# Patient Record
Sex: Male | Born: 1960 | Race: White | Hispanic: No | Marital: Married | Smoking: Former smoker
Health system: Southern US, Community
[De-identification: ages and names within clinical notes are randomized; demographics above are authoritative.]

## PROBLEM LIST (undated history)

## (undated) DIAGNOSIS — B019 Varicella without complication: Secondary | ICD-10-CM

## (undated) DIAGNOSIS — K8020 Calculus of gallbladder without cholecystitis without obstruction: Secondary | ICD-10-CM

## (undated) HISTORY — DX: Varicella without complication: B01.9

## (undated) HISTORY — DX: Calculus of gallbladder without cholecystitis without obstruction: K80.20

---
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
## 2008-12-18 ENCOUNTER — Emergency Department: Payer: Self-pay | Admitting: Internal Medicine

## 2009-03-18 HISTORY — PX: FINGER AMPUTATION: SHX636

## 2009-04-26 ENCOUNTER — Ambulatory Visit: Payer: Self-pay | Admitting: Family Medicine

## 2009-05-01 ENCOUNTER — Ambulatory Visit: Payer: Self-pay | Admitting: Family Medicine

## 2009-11-04 ENCOUNTER — Emergency Department: Payer: Self-pay | Admitting: Emergency Medicine

## 2010-04-16 ENCOUNTER — Ambulatory Visit: Payer: Self-pay | Admitting: Unknown Physician Specialty

## 2010-04-16 DIAGNOSIS — Z86010 Personal history of colonic polyps: Secondary | ICD-10-CM | POA: Insufficient documentation

## 2010-04-16 LAB — HM COLONOSCOPY

## 2010-04-18 LAB — PATHOLOGY REPORT

## 2013-02-02 LAB — LIPID PANEL
CHOLESTEROL: 218 mg/dL — AB (ref 0–200)
HDL: 38 mg/dL (ref 35–70)
LDL Cholesterol: 145 mg/dL
TRIGLYCERIDES: 177 mg/dL — AB (ref 40–160)

## 2013-02-02 LAB — BASIC METABOLIC PANEL
BUN: 10 mg/dL (ref 4–21)
CREATININE: 1 mg/dL (ref 0.6–1.3)
Glucose: 93 mg/dL
POTASSIUM: 4.2 mmol/L (ref 3.4–5.3)
SODIUM: 142 mmol/L (ref 137–147)

## 2013-02-02 LAB — PSA: PSA: 1.7

## 2013-02-02 LAB — HEPATIC FUNCTION PANEL
ALT: 33 U/L (ref 10–40)
AST: 22 U/L (ref 14–40)

## 2014-02-18 LAB — BASIC METABOLIC PANEL
BUN: 13 mg/dL (ref 4–21)
CREATININE: 1.4 mg/dL — AB (ref 0.6–1.3)
Glucose: 95 mg/dL
Potassium: 4.1 mmol/L (ref 3.4–5.3)
Sodium: 143 mmol/L (ref 137–147)

## 2014-02-18 LAB — LIPID PANEL
CHOLESTEROL: 230 mg/dL — AB (ref 0–200)
HDL: 35 mg/dL (ref 35–70)
LDL Cholesterol: 149 mg/dL
Triglycerides: 230 mg/dL — AB (ref 40–160)

## 2014-02-18 LAB — HEPATIC FUNCTION PANEL
ALT: 31 U/L (ref 10–40)
AST: 23 U/L (ref 14–40)

## 2014-02-18 LAB — CBC AND DIFFERENTIAL
HCT: 42 % (ref 41–53)
Hemoglobin: 15.5 g/dL (ref 13.5–17.5)
Platelets: 250 10*3/uL (ref 150–399)
WBC: 9.4 10^3/mL

## 2014-02-18 LAB — PSA: PSA: 1.9

## 2014-02-18 LAB — TSH: TSH: 1.06 u[IU]/mL (ref 0.41–5.90)

## 2014-09-20 ENCOUNTER — Telehealth: Payer: Self-pay

## 2014-09-20 NOTE — Telephone Encounter (Signed)
Patient called with complaint of skin rash. Patient first noticed it 3-4 days ago. Patient describes rash as red and itchy.Rash is located on his knees, shins, forearms, abdomen and hands.  Patient thinks it may be eczema. Patient has been using Hydrocortisone cream which has helped improve the rash. Patient states he has some blisters on the back of his hand. Patient denies any pain from the rash. Appointment scheduled with Dr. Caryn Section tomorrow 09/21/2014 at 1:45pm for evaluation.

## 2014-09-21 ENCOUNTER — Encounter: Payer: Self-pay | Admitting: *Deleted

## 2014-09-21 ENCOUNTER — Ambulatory Visit (INDEPENDENT_AMBULATORY_CARE_PROVIDER_SITE_OTHER): Payer: 59 | Admitting: Family Medicine

## 2014-09-21 ENCOUNTER — Encounter: Payer: Self-pay | Admitting: Family Medicine

## 2014-09-21 VITALS — BP 112/76 | HR 78 | Temp 98.7°F | Resp 16 | Wt 235.0 lb

## 2014-09-21 DIAGNOSIS — R21 Rash and other nonspecific skin eruption: Secondary | ICD-10-CM | POA: Insufficient documentation

## 2014-09-21 DIAGNOSIS — E669 Obesity, unspecified: Secondary | ICD-10-CM | POA: Insufficient documentation

## 2014-09-21 DIAGNOSIS — D487 Neoplasm of uncertain behavior of other specified sites: Secondary | ICD-10-CM | POA: Insufficient documentation

## 2014-09-21 DIAGNOSIS — L219 Seborrheic dermatitis, unspecified: Secondary | ICD-10-CM | POA: Insufficient documentation

## 2014-09-21 DIAGNOSIS — E785 Hyperlipidemia, unspecified: Secondary | ICD-10-CM | POA: Insufficient documentation

## 2014-09-21 DIAGNOSIS — Z720 Tobacco use: Secondary | ICD-10-CM

## 2014-09-21 MED ORDER — SULFAMETHOXAZOLE-TRIMETHOPRIM 800-160 MG PO TABS
2.0000 | ORAL_TABLET | Freq: Two times a day (BID) | ORAL | Status: AC
Start: 2014-09-21 — End: 2014-10-01

## 2014-09-21 NOTE — Progress Notes (Signed)
       Patient: Austin Larson Male    DOB: 1960/10/21   54 y.o.   MRN: [id number] Visit Date: 09/21/2014  Today's Provider: Lelon Huh, MD   Chief Complaint  Patient presents with  . Rash   Subjective:    Rash This is a new problem. The current episode started in the past 7 days (appeared 3-4 days ago). The problem has been gradually improving since onset. The affected locations include the left arm, right arm, right lowerleg and left lower leg. The rash is characterized by blistering, itchiness and redness. Pertinent negatives include no anorexia, congestion, cough, diarrhea, eye pain, facial edema, fatigue, fever, joint pain, nail changes, rhinorrhea, shortness of breath, sore throat or vomiting. Past treatments include anti-itch cream and antihistamine. The treatment provided mild relief. His past medical history is significant for eczema (as a child).    Has had no recent travel. No exposure to wild plants such as poison oak or ivy. Doesn't itch much at all during the day, but taking OTC antihistamine for itching at night.    Previous Medications   ASPIRIN 81 MG TABLET    Take by mouth.   COENZYME Q10 (COQ10 PO)    Take 1 tablet by mouth daily.   MULTIPLE VITAMIN PO    Take by mouth.   OMEGA-3 FATTY ACIDS (FISH OIL) 1000 MG CAPS    Take by mouth.    Review of Systems  Constitutional: Negative for fever and fatigue.  HENT: Negative for congestion, rhinorrhea and sore throat.   Eyes: Negative for pain.  Respiratory: Negative for cough and shortness of breath.   Gastrointestinal: Negative for vomiting, diarrhea and anorexia.  Musculoskeletal: Negative for joint pain.  Skin: Positive for rash. Negative for nail changes.    History  Substance Use Topics  . Smoking status: Current Some Day Smoker    Types: Cigars  . Smokeless tobacco: Not on file  . Alcohol Use: 0.0 oz/week    0 Standard drinks or equivalent per week     Comment: 1 beer every 2 weeks   Objective:   BP  112/76 mmHg  Pulse 78  Temp(Src) 98.7 F (37.1 C) (Oral)  Resp 16  Wt 235 lb (106.595 kg)  Physical Exam  Scattered scabbed lesions across flexor surfaces of arms, all around both LEs and a few scattered lesions across side of abdomen, with 1-2 mm erythema around central scabs. Moderate excoriations noted anterior leg.     Assessment & Plan:     1. Rash Doubt contact dermatitis due to only mild itching and lack of exposure. May be staph infection. Not at all consistent with tinea. No burrows seen. Cover from staph while awaiting culture results.  - sulfamethoxazole-trimethoprim (BACTRIM DS,SEPTRA DS) 800-160 MG per tablet; Take 2 tablets by mouth 2 (two) times daily.  Dispense: 40 tablet; Refill: 0 - Wound culture   Follow up: No Follow-up on file.      Lelon Huh, MD  [facility]

## 2014-09-23 ENCOUNTER — Telehealth: Payer: Self-pay | Admitting: Family Medicine

## 2014-09-23 LAB — WOUND CULTURE

## 2014-09-23 NOTE — Telephone Encounter (Signed)
Patient notified

## 2014-09-23 NOTE — Telephone Encounter (Signed)
So far culture has grown a few gram positive cocci, which is probably staph. Final report won't be back until Monday. Need to continue antibiotic for now.

## 2014-09-23 NOTE — Telephone Encounter (Signed)
Please advise 

## 2014-09-23 NOTE — Telephone Encounter (Signed)
Pt would like a call back to get the results for the culture that was done on Wednesday 09/21/14. Thanks TNP

## 2014-09-26 ENCOUNTER — Telehealth: Payer: Self-pay

## 2014-09-26 DIAGNOSIS — R21 Rash and other nonspecific skin eruption: Secondary | ICD-10-CM

## 2014-09-26 NOTE — Telephone Encounter (Signed)
-----   Message from Birdie Sons, MD sent at 09/23/2014  8:38 PM EDT ----- Cultures are positive for Staph bacteria. No MRSA. Should clear up with abx that were prescribed. Refer to dermatology if not clearing up.

## 2014-09-26 NOTE — Telephone Encounter (Signed)
LMTCB Emily Drozdowski, CMA  

## 2014-09-26 NOTE — Telephone Encounter (Signed)
Please refer to dermatology for rash. Thanks.

## 2014-09-26 NOTE — Telephone Encounter (Signed)
Pt advised, he would like to go ahead with the referral to Dermatology.  He reports that the infection is not getting any better, it is actually starting to spread.   Thanks,   -Mickel Baas

## 2014-10-03 NOTE — Addendum Note (Signed)
Addended by: Birdie Sons on: 10/03/2014 01:26 PM   Modules accepted: Orders

## 2014-10-03 NOTE — Telephone Encounter (Signed)
Please refer dermatology

## 2014-11-22 ENCOUNTER — Encounter: Payer: Self-pay | Admitting: Family Medicine

## 2014-11-30 ENCOUNTER — Encounter: Payer: Self-pay | Admitting: Family Medicine

## 2014-12-13 ENCOUNTER — Encounter: Payer: Self-pay | Admitting: Family Medicine

## 2014-12-29 ENCOUNTER — Ambulatory Visit (INDEPENDENT_AMBULATORY_CARE_PROVIDER_SITE_OTHER): Payer: 59 | Admitting: Family Medicine

## 2014-12-29 ENCOUNTER — Encounter: Payer: Self-pay | Admitting: Family Medicine

## 2014-12-29 VITALS — BP 122/86 | HR 68 | Temp 98.2°F | Resp 16 | Ht 71.5 in | Wt 236.0 lb

## 2014-12-29 DIAGNOSIS — E669 Obesity, unspecified: Secondary | ICD-10-CM | POA: Diagnosis not present

## 2014-12-29 DIAGNOSIS — E785 Hyperlipidemia, unspecified: Secondary | ICD-10-CM | POA: Diagnosis not present

## 2014-12-29 DIAGNOSIS — Z8249 Family history of ischemic heart disease and other diseases of the circulatory system: Secondary | ICD-10-CM | POA: Diagnosis not present

## 2014-12-29 DIAGNOSIS — Z0000 Encounter for general adult medical examination without abnormal findings: Secondary | ICD-10-CM

## 2014-12-29 DIAGNOSIS — R079 Chest pain, unspecified: Secondary | ICD-10-CM

## 2014-12-29 NOTE — Progress Notes (Signed)
Patient: Austin Larson, Male    DOB: 23-Apr-1960, 54 y.o.   MRN: [id number] Visit Date: 12/29/2014  Today's Provider: Lelon Huh, MD   Chief Complaint  Patient presents with  . Annual Exam  . Hyperlipidemia    follow up  . Blood Pressure Check    follow up elevated Blood pressure   Subjective:    Annual physical exam Austin Larson is a 54 y.o. male who presents today for health maintenance and complete physical. He feels well. He reports no regular exercise . He reports he is sleeping well.  -----------------------------------------------------------------  Lipid/Cholesterol, Follow-up:   Last seen for this10 months ago. Patient was seen by Vernie Murders PA-C. Management changes during that visit include recommend taking Krill oil or red yeast rice with Metamucil, watching diet and exercising 30 minutes daily. . Last Lipid Panel:    Component Value Date/Time   CHOL 230* 02/18/2014   TRIG 230* 02/18/2014   HDL 35 02/18/2014   LDLCALC 149 02/18/2014    Risk factors for vascular disease include hypercholesterolemia and family history.    Patient did not start taking Krill oil or Red Yeast   Current symptoms include none and have been stable. Weight trend: stable Prior visit with dietician: no Current diet: in general, a "healthy" diet   Current exercise: none   Weight trend: stable Wt Readings from Last 3 Encounters:  09/21/14 235 lb (106.595 kg)  02/15/14 242 lb (109.77 kg)    Current diet: in general, a "healthy" diet    ------------------------------------------------------------------------  Chest pain He states he occasionally has brief episodes of chest pains that do not radiate. Unclear if related to exertion. No associated dyspnea or diaphoresis. Lasts just a few seconds to a few minutes. They are not very severe but he is concerned due to his father developing heart disease in his 83s.    Review of Systems  Constitutional: Negative for  fever, chills, appetite change and fatigue.  HENT: Negative for congestion, ear pain, hearing loss, nosebleeds and trouble swallowing.   Eyes: Negative for pain and visual disturbance.  Respiratory: Negative for cough, chest tightness and shortness of breath.   Cardiovascular: Positive for chest pain. Negative for palpitations and leg swelling.  Gastrointestinal: Negative for nausea, vomiting, abdominal pain, diarrhea, constipation and blood in stool.  Endocrine: Negative for polydipsia, polyphagia and polyuria.  Genitourinary: Negative for dysuria and flank pain.  Musculoskeletal: Negative for myalgias, back pain, joint swelling, arthralgias and neck stiffness.  Skin: Negative for color change, rash and wound.  Neurological: Negative for dizziness, tremors, seizures, speech difficulty, weakness, light-headedness and headaches.  Psychiatric/Behavioral: Negative for behavioral problems, confusion, sleep disturbance, dysphoric mood and decreased concentration. The patient is not nervous/anxious.   All other systems reviewed and are negative.   Social History He  reports that he has been smoking Cigars.  He does not have any smokeless tobacco history on file. He reports that he drinks alcohol. He reports that he does not use illicit drugs. Social History   Social History  . Marital Status: Married    Spouse Name: N/A  . Number of Children: 3  . Years of Education: Coll Grad   Occupational History  . Full-Time    Social History Main Topics  . Smoking status: Current Some Day Smoker    Types: Cigars  . Smokeless tobacco: Not on file  . Alcohol Use: 0.0 oz/week    0 Standard drinks or equivalent per week  Comment: 1 beer every 2 weeks  . Drug Use: No  . Sexual Activity: Not on file   Other Topics Concern  . Not on file   Social History Narrative    Patient Active Problem List   Diagnosis Date Noted  . Dyslipidemia 09/21/2014  . Obesity 09/21/2014  . Seborrhea 09/21/2014    . Rash 09/21/2014  . Tobacco abuse 09/21/2014  . History of adenomatous polyp of colon 04/16/2010  . Family history of ischemic heart disease 08/17/2008  . Fatty liver 10/21/2005  . Gallstones 03/18/2004    Past Surgical History  Procedure Laterality Date  . Appendectomy  1974  . Tonsillectomy  1970  . 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 Mother Deceased 65    Breast and Ovarian cancer  . Father Alive   . Sister Alive   . Brother Alive   . Daughter Alive   . Daughter Alive   . Daughter Alive    His family history includes Bipolar disorder in his father; Breast cancer in his mother; Diabetes in his father; Heart disease in his father; Ovarian cancer in his mother.    No Known Allergies  Previous Medications   ASPIRIN 81 MG TABLET    Take by mouth.   COENZYME Q10 (COQ10 PO)    Take 1 tablet by mouth daily.   MULTIPLE VITAMIN PO    Take by mouth.   OMEGA-3 FATTY ACIDS (FISH OIL) 1000 MG CAPS    Take by mouth.    Patient Care Team: Birdie Sons, MD as PCP - General (Family Medicine)     Objective:   Vitals: BP 122/86 mmHg  Pulse 68  Temp(Src) 98.2 F (36.8 C) (Oral)  Resp 16  Ht 5' 11.5" (1.816 m)  Wt 236 lb (107.049 kg)  BMI 32.46 kg/m2   Physical Exam   General Appearance:    Alert, cooperative, no distress, appears stated age, overweight  Head:    Normocephalic, without obvious abnormality, atraumatic  Eyes:    PERRL, conjunctiva/corneas clear, EOM's intact, fundi    benign, both eyes       Ears:    Normal TM's and external ear canals, both ears  Nose:   Nares normal, septum midline, mucosa normal, no drainage   or sinus tenderness  Throat:   Lips, mucosa, and tongue normal; teeth and gums normal  Neck:   Supple, symmetrical, trachea midline, no adenopathy;       thyroid:  No enlargement/tenderness/nodules; no carotid   bruit or JVD  Back:     Symmetric, no  curvature, ROM normal, no CVA tenderness  Lungs:     Clear to auscultation bilaterally, respirations unlabored  Chest wall:    No tenderness or deformity  Heart:    Regular rate and rhythm, S1 and S2 normal, no murmur, rub   or gallop  Abdomen:     Soft, non-tender, bowel sounds active all four quadrants,    no masses, no organomegaly  Genitalia:    deferred  Rectal:    deferred  Extremities:   Extremities normal, atraumatic, no cyanosis or edema  Pulses:   2+ and symmetric all extremities  Skin:   Skin color, texture, turgor normal, no rashes or lesions  Lymph nodes:   Cervical, supraclavicular, and axillary nodes normal  Neurologic:   CNII-XII intact. Normal strength, sensation and reflexes  throughout    Depression Screen PHQ 2/9 Scores 12/29/2014  PHQ - 2 Score 0  PHQ- 9 Score 0      Assessment & Plan:     Routine Health Maintenance and Physical Exam  Exercise Activities and Dietary recommendations Goals    None      Immunization History  Administered Date(s) Administered  . Influenza,inj,Quad PF,36+ Mos 12/16/2013  . Tdap 08/17/2008    Health Maintenance  Topic Date Due  . HIV Screening  10/09/1973  . INFLUENZA VACCINE  06/17/2015 (Originally 10/17/2014)  . COLONOSCOPY  04/17/2015  . TETANUS/TDAP  08/18/2018  . Hepatitis C Screening  Completed      Discussed health benefits of physical activity, and encouraged him to engage in regular exercise appropriate for his age and condition.    --------------------------------------------------------------------  1. Annual physical exam  - Comprehensive metabolic panel - PSA  2. Obesity   3. Family history of ischemic heart disease  - Ambulatory referral to Cardiology  4. Dyslipidemia  - Lipid panel - TSH  5. Chest pain, unspecified chest pain type Not typical, but does have significant cardiac risk factors. Continue ASA for now.  - Ambulatory referral to Cardiology

## 2014-12-29 NOTE — Patient Instructions (Addendum)
   Please contact your eyecare professional to schedule a routine eye exam  

## 2014-12-30 ENCOUNTER — Telehealth: Payer: Self-pay | Admitting: Family Medicine

## 2014-12-30 LAB — COMPREHENSIVE METABOLIC PANEL
ALT: 28 IU/L (ref 0–44)
AST: 23 IU/L (ref 0–40)
Albumin/Globulin Ratio: 2.4 (ref 1.1–2.5)
Albumin: 4.7 g/dL (ref 3.5–5.5)
Alkaline Phosphatase: 75 IU/L (ref 39–117)
BUN/Creatinine Ratio: 7 — ABNORMAL LOW (ref 9–20)
BUN: 10 mg/dL (ref 6–24)
Bilirubin Total: 0.6 mg/dL (ref 0.0–1.2)
CALCIUM: 9.7 mg/dL (ref 8.7–10.2)
CHLORIDE: 102 mmol/L (ref 97–108)
CO2: 28 mmol/L (ref 18–29)
Creatinine, Ser: 1.44 mg/dL — ABNORMAL HIGH (ref 0.76–1.27)
GFR calc Af Amer: 62 mL/min/{1.73_m2} (ref >59)
GFR, EST NON AFRICAN AMERICAN: 54 mL/min/{1.73_m2} — AB (ref >59)
Globulin, Total: 2 g/dL (ref 1.5–4.5)
Glucose: 93 mg/dL (ref 65–99)
Potassium: 4.5 mmol/L (ref 3.5–5.2)
Sodium: 145 mmol/L — ABNORMAL HIGH (ref 134–144)
Total Protein: 6.7 g/dL (ref 6.0–8.5)

## 2014-12-30 LAB — PSA: Prostate Specific Ag, Serum: 1.9 ng/mL (ref 0.0–4.0)

## 2014-12-30 LAB — LIPID PANEL
CHOL/HDL RATIO: 6.1 ratio — AB (ref 0.0–5.0)
Cholesterol, Total: 227 mg/dL — ABNORMAL HIGH (ref 100–199)
HDL: 37 mg/dL — ABNORMAL LOW (ref >39)
LDL Calculated: 150 mg/dL — ABNORMAL HIGH (ref 0–99)
Triglycerides: 202 mg/dL — ABNORMAL HIGH (ref 0–149)
VLDL Cholesterol Cal: 40 mg/dL (ref 5–40)

## 2014-12-30 LAB — TSH: TSH: 0.661 u[IU]/mL (ref 0.450–4.500)

## 2014-12-30 NOTE — Telephone Encounter (Signed)
-----   Message from Birdie Sons, MD sent at 12/30/2014  7:59 AM EDT ----- LDL cholesterol it too high at 150 and HDL is too low at 37, which puts him at twice normal risk for developing heart or vascular disease. Recommend he start pravastatin 40mg  daily, #30, rf x 3 and return in 6-8 weeks for office visit and labs.   All other labs are normal.

## 2014-12-30 NOTE — Telephone Encounter (Signed)
Pt is returning call.  CB#[phone]/MW

## 2014-12-30 NOTE — Telephone Encounter (Signed)
Patient stated that he does not want to start a medication for cholesterol at this time. Patient wants to try diet and exercise for the next 6 weeks, then come back for follow-up w/labs. Patient said that if Dr. Caryn Section is all right with this he would like to have labs done before ov. Please advise? Patient already scheduled follow-up ov appt for 02/01/15.

## 2014-12-30 NOTE — Telephone Encounter (Signed)
Patient notified of results. Patient expressed understanding.  

## 2015-02-01 ENCOUNTER — Ambulatory Visit: Payer: Self-pay | Admitting: Family Medicine

## 2015-03-03 ENCOUNTER — Ambulatory Visit (INDEPENDENT_AMBULATORY_CARE_PROVIDER_SITE_OTHER): Payer: 59 | Admitting: Cardiovascular Disease

## 2015-03-03 ENCOUNTER — Encounter: Payer: Self-pay | Admitting: Cardiovascular Disease

## 2015-03-03 VITALS — BP 120/88 | HR 73 | Ht 71.0 in | Wt 232.0 lb

## 2015-03-03 DIAGNOSIS — Z8249 Family history of ischemic heart disease and other diseases of the circulatory system: Secondary | ICD-10-CM | POA: Diagnosis not present

## 2015-03-03 DIAGNOSIS — R079 Chest pain, unspecified: Secondary | ICD-10-CM | POA: Diagnosis not present

## 2015-03-03 DIAGNOSIS — R0789 Other chest pain: Secondary | ICD-10-CM | POA: Insufficient documentation

## 2015-03-03 DIAGNOSIS — E785 Hyperlipidemia, unspecified: Secondary | ICD-10-CM | POA: Diagnosis not present

## 2015-03-03 NOTE — Assessment & Plan Note (Signed)
The chest pain is atypical and nonexertional. His cardiac physical exam is unremarkable and baseline EKG is normal. Given his family history of coronary artery disease, I requested a treadmill stress test. I discussed with the patient the importance of lifestyle changes in order to decrease the chance of future coronary artery disease and cardiovascular events. We discussed the importance of controlling risk factors, healthy diet as well as regular exercise. I also explained to him that a normal stress test does not rule out atherosclerosis.

## 2015-03-03 NOTE — Assessment & Plan Note (Signed)
Given his family history of coronary artery disease and hyperlipidemia, I discussed with him the option of proceeding with coronary calcium score if stress test comes back normal. He will decide after stress test.

## 2015-03-03 NOTE — Patient Instructions (Signed)
Medication Instructions:  Your physician recommends that you continue on your current medications as directed. Please refer to the Current Medication list given to you today.   Labwork: none  Testing/Procedures: Your physician has requested that you have an exercise tolerance test. For further information please visit [email or web address]. Please also follow instruction sheet, as given.    Follow-Up: Your physician recommends that you schedule a follow-up appointment as needed.    Any Other Special Instructions Will Be Listed Below (If Applicable).     If you need a refill on your cardiac medications before your next appointment, please call your pharmacy.  Exercise Stress Electrocardiogram An exercise stress electrocardiogram is a test that is done to evaluate the blood supply to your heart. This test may also be called exercise stress electrocardiography. The test is done while you are walking on a treadmill. The goal of this test is to raise your heart rate. This test is done to find areas of poor blood flow to the heart by determining the extent of coronary artery disease (CAD).   CAD is defined as narrowing in one or more heart (coronary) arteries of more than 70%. If you have an abnormal test result, this may mean that you are not getting adequate blood flow to your heart during exercise. Additional testing may be needed to understand why your test was abnormal. LET [facility] CARE PROVIDER KNOW ABOUT:   Any allergies you have.  All medicines you are taking, including vitamins, herbs, eye drops, creams, and over-the-counter medicines.  Previous problems you or members of your family have had with the use of anesthetics.  Any blood disorders you have.  Previous surgeries you have had.  Medical conditions you have.  Possibility of pregnancy, if this applies. RISKS AND COMPLICATIONS Generally, this is a safe procedure. However, as with any procedure, complications can  occur. Possible complications can include:  Pain or pressure in the following areas:  Chest.  Jaw or neck.  Between your shoulder blades.  Radiating down your left arm.  Dizziness or light-headedness.  Shortness of breath.  Increased or irregular heartbeats.  Nausea or vomiting.  Heart attack (rare). BEFORE THE PROCEDURE  Avoid all forms of caffeine 24 hours before your test or as directed by your health care provider. This includes coffee, tea (even decaffeinated tea), caffeinated sodas, chocolate, cocoa, and certain pain medicines.  Follow your health care provider's instructions regarding eating and drinking before the test.  Take your medicines as directed at regular times with water unless instructed otherwise. Exceptions may include:  If you have diabetes, ask how you are to take your insulin or pills. It is common to adjust insulin dosing the morning of the test.  If you are taking beta-blocker medicines, it is important to talk to your health care provider about these medicines well before the date of your test. Taking beta-blocker medicines may interfere with the test. In some cases, these medicines need to be changed or stopped 24 hours or more before the test.  If you wear a nitroglycerin patch, it may need to be removed prior to the test. Ask your health care provider if the patch should be removed before the test.  If you use an inhaler for any breathing condition, bring it with you to the test.  If you are an outpatient, bring a snack so you can eat right after the stress phase of the test.  Do not smoke for 4 hours prior to the test or as  directed by your health care provider.  Do not apply lotions, powders, creams, or oils on your chest prior to the test.  Wear loose-fitting clothes and comfortable shoes for the test. This test involves walking on a treadmill. PROCEDURE  Multiple patches (electrodes) will be put on your chest. If needed, small areas of  your chest may have to be shaved to get better contact with the electrodes. Once the electrodes are attached to your body, multiple wires will be attached to the electrodes and your heart rate will be monitored.  Your heart will be monitored both at rest and while exercising.  You will walk on a treadmill. The treadmill will be started at a slow pace. The treadmill speed and incline will gradually be increased to raise your heart rate. AFTER THE PROCEDURE  Your heart rate and blood pressure will be monitored after the test.  You may return to your normal schedule including diet, activities, and medicines, unless your health care provider tells you otherwise.   This information is not intended to replace advice given to you by your health care provider. Make sure you discuss any questions you have with your health care provider.   Document Released: 03/01/2000 Document Revised: 03/09/2013 Document Reviewed: 11/09/2012 Elsevier Interactive Patient Education [employer].

## 2015-03-03 NOTE — Assessment & Plan Note (Signed)
Lab Results  Component Value Date   CHOL 227* 12/29/2014   HDL 37* 12/29/2014   LDLCALC 150* 12/29/2014   TRIG 202* 12/29/2014   CHOLHDL 6.1* 12/29/2014   The patient did not start treatment with pravastatin as was prescribed. I discussed with him the risks and benefits of treatment with statins. His lipids have been elevated over the last few years and I think he should strongly consider this. I do think that if there is atherosclerosis on CT calcium score, that will strongly favor aggressive treatment of his lipids.

## 2015-03-03 NOTE — Progress Notes (Signed)
Primary care physician: Dr. Caryn Section.  HPI  This is a pleasant 54 year old male who was referred for evaluation of chest pain. He has no previous cardiac history. He has known history of hyperlipidemia but no history of diabetes or hypertension. He is not a smoker. He smokes one to 2 cigars per year with no other nicotine products. He has family history of premature coronary artery disease. His father had CABG more than one time starting in his 31s. He was a smoker and a diabetic. The patient has known history of hyperlipidemia with most recent LDL of 150. He was started on pravastatin but did not take the medication as he is concerned about taking any statin. He reports intermittent rare episodes of left-sided chest pain usually not exertional lasting for a few minutes and described as aching sensation with no other associated symptoms. There has been no syncope, presyncope or dizziness. He works as a Hotel manager. He reports having cardiac workup about 20 years ago.  No Known Allergies   Current Outpatient Prescriptions on File Prior to Visit  Medication Sig Dispense Refill  . aspirin 81 MG tablet Take by mouth.    . Coenzyme Q10 (COQ10 PO) Take 1 tablet by mouth daily.    . MULTIPLE VITAMIN PO Take by mouth.    . Omega-3 Fatty Acids (FISH OIL) 1000 MG CAPS Take by mouth.     No current facility-administered medications on file prior to visit.     Past Medical History  Diagnosis Date  . Chicken pox   . Gallstones      Past Surgical History  Procedure Laterality Date  . Appendectomy  1974  . Tonsillectomy  1970  . 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 Ovarian cancer Mother   . Breast cancer Mother   . Diabetes Father   . Heart disease Father   . Bipolar disorder Father      Social History   Social History  . Marital Status: Married    Spouse Name: N/A  . Number of Children: 3  . Years of  Education: Coll Grad   Occupational History  . Investment banker, operational    Social History Main Topics  . Smoking status: Current Some Day Smoker    Types: Cigars  . Smokeless tobacco: Not on file  . Alcohol Use: 0.0 oz/week    0 Standard drinks or equivalent per week     Comment: 1 beer every 2 weeks  . Drug Use: No  . Sexual Activity: Not on file   Other Topics Concern  . Not on file   Social History Narrative     ROS A 10 point review of system was performed. It is negative other than that mentioned in the history of present illness.   PHYSICAL EXAM   BP 120/88 mmHg  Pulse 73  Ht 5\' 11"  (1.803 m)  Wt 232 lb (105.235 kg)  BMI 32.37 kg/m2 Constitutional: He is oriented to person, place, and time. He appears well-developed and well-nourished. No distress.  HENT: No nasal discharge.  Head: Normocephalic and atraumatic.  Eyes: Pupils are equal and round.  No discharge. Neck: Normal range of motion. Neck supple. No JVD present. No thyromegaly present.  Cardiovascular: Normal rate, regular rhythm, normal heart sounds. Exam reveals no gallop and no friction rub. No murmur heard.  Pulmonary/Chest: Effort normal and breath sounds normal. No stridor.  No respiratory distress. He has no wheezes. He has no rales. He exhibits no tenderness.  Abdominal: Soft. Bowel sounds are normal. He exhibits no distension. There is no tenderness. There is no rebound and no guarding.  Musculoskeletal: Normal range of motion. He exhibits no edema and no tenderness.  Neurological: He is alert and oriented to person, place, and time. Coordination normal.  Skin: Skin is warm and dry. No rash noted. He is not diaphoretic. No erythema. No pallor.  Psychiatric: He has a normal mood and affect. His behavior is normal. Judgment and thought content normal.       EKG: Normal sinus rhythm with no significant ST or T wave changes.   ASSESSMENT AND PLAN

## 2015-04-12 ENCOUNTER — Encounter (INDEPENDENT_AMBULATORY_CARE_PROVIDER_SITE_OTHER): Payer: Self-pay

## 2015-04-12 ENCOUNTER — Ambulatory Visit (INDEPENDENT_AMBULATORY_CARE_PROVIDER_SITE_OTHER): Payer: 59

## 2015-04-12 DIAGNOSIS — R079 Chest pain, unspecified: Secondary | ICD-10-CM

## 2015-04-12 LAB — EXERCISE TOLERANCE TEST
CSEPHR: 90 %
CSEPPHR: 148 {beats}/min
Estimated workload: 10.1 METS
Exercise duration (min): 8 min
Exercise duration (sec): 59 s
MPHR: 164 {beats}/min
Rest HR: 76 {beats}/min

## 2016-01-25 ENCOUNTER — Ambulatory Visit (INDEPENDENT_AMBULATORY_CARE_PROVIDER_SITE_OTHER): Payer: 59

## 2016-01-25 DIAGNOSIS — Z23 Encounter for immunization: Secondary | ICD-10-CM | POA: Diagnosis not present

## 2016-03-04 ENCOUNTER — Telehealth: Payer: Self-pay | Admitting: Cardiovascular Disease

## 2016-03-04 ENCOUNTER — Other Ambulatory Visit: Payer: Self-pay

## 2016-03-04 DIAGNOSIS — Z8249 Family history of ischemic heart disease and other diseases of the circulatory system: Secondary | ICD-10-CM

## 2016-03-04 NOTE — Telephone Encounter (Signed)
Per 03/03/15 notes: "Given his family history of coronary artery disease and hyperlipidemia, I discussed with him the option of proceeding with coronary calcium score if stress test comes back normal. He will decide after stress test." 04/12/15 GXT normal. CT cardiac scoring scheduled 12/22 @ 10:15am Left message on machine for patient to contact the office.

## 2016-03-04 NOTE — Telephone Encounter (Signed)
Pt called back and is agreeable to Dec 22, 10:15am CT cardiac scoring. He understands it is $150 out-of-pocket and not covered by insurance.  Provided address to [place] location.

## 2016-03-04 NOTE — Telephone Encounter (Signed)
Patient calling wanting to know if he can have CT calcium score. Patient not having any symptoms. For his own piece of mind. Please call patient.

## 2016-03-08 ENCOUNTER — Ambulatory Visit (INDEPENDENT_AMBULATORY_CARE_PROVIDER_SITE_OTHER)
Admission: RE | Admit: 2016-03-08 | Discharge: 2016-03-08 | Disposition: A | Discharge status: Home / Self Care | Payer: Self-pay | Source: Ambulatory Visit | Attending: Cardiovascular Disease | Admitting: Cardiovascular Disease

## 2016-03-08 ENCOUNTER — Other Ambulatory Visit: Payer: Self-pay

## 2016-03-08 DIAGNOSIS — R931 Abnormal findings on diagnostic imaging of heart and coronary circulation: Secondary | ICD-10-CM

## 2016-03-08 DIAGNOSIS — Z8249 Family history of ischemic heart disease and other diseases of the circulatory system: Secondary | ICD-10-CM

## 2017-01-16 ENCOUNTER — Encounter: Payer: Self-pay | Admitting: Family Medicine

## 2017-02-18 IMAGING — CT CT HEART SCORING
2 series · 16 of 20 positions shown, 18 images · non-contrast
Comparison: No priors.

CLINICAL DATA: Risk stratification

EXAM:
Coronary Calcium Score
TECHNIQUE: The patient was scanned on a Siemens Sensation 16 slice scanner.
Axial non-contrast 3mm slices were carried out through the heart.
The data set was analyzed on a dedicated work station and scored
using the Agatson method.

[Series 2: casc 3.0 i36f 2 bestdiast 71 % · axial · 0.37mm/px · z∈[-230,-125]mm · 8 of 45 slices shown, 10 images]
[im 5/45  vessel]
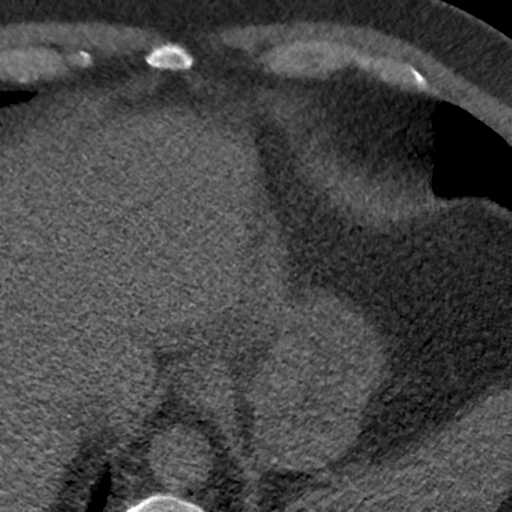
[im 5/45  lung]
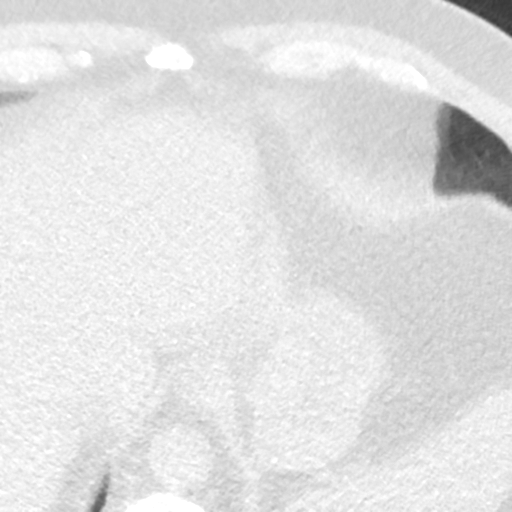
[im 10/45  vessel]
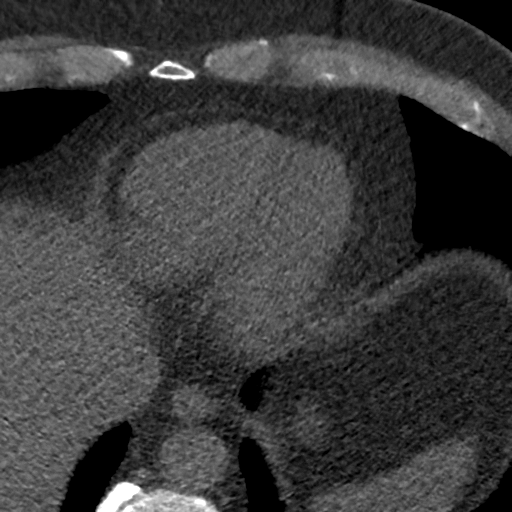
[im 15/45  vessel]
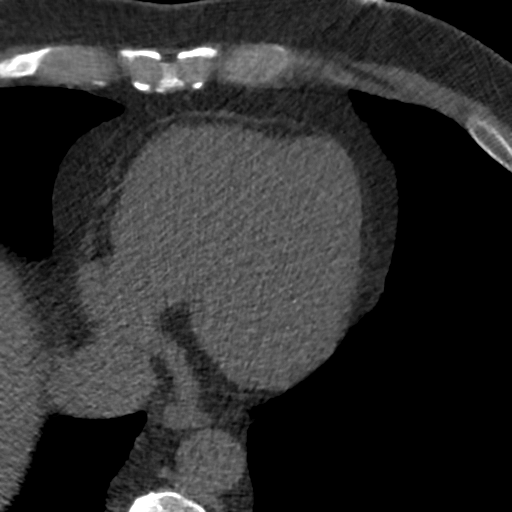
[im 20/45  vessel]
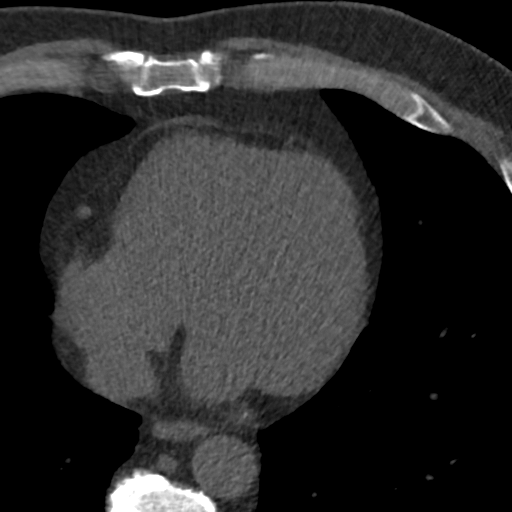
[im 25/45  vessel]
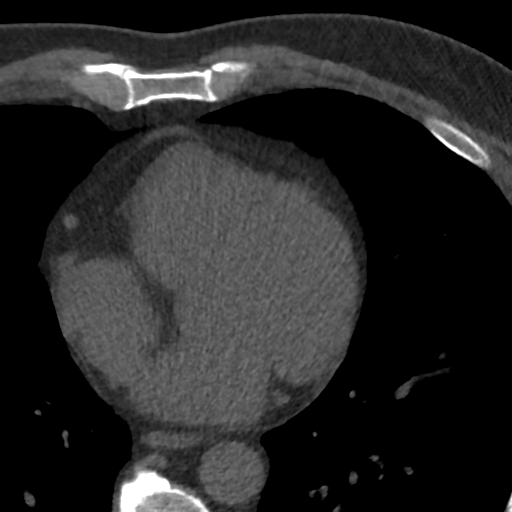
[im 25/45  lung]
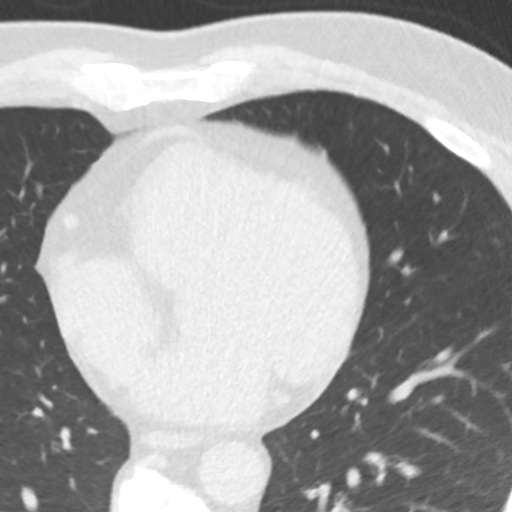
[im 30/45  vessel]
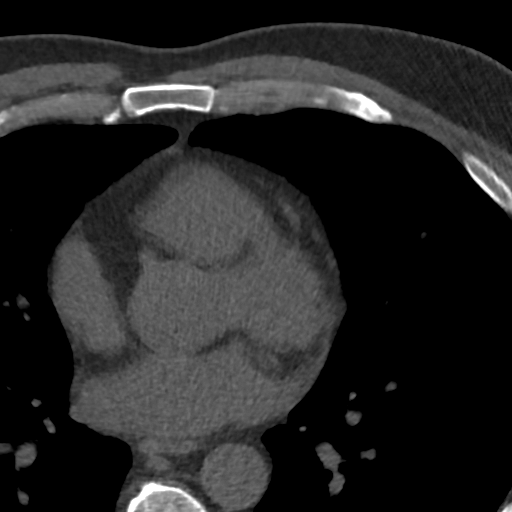
[im 35/45  vessel]
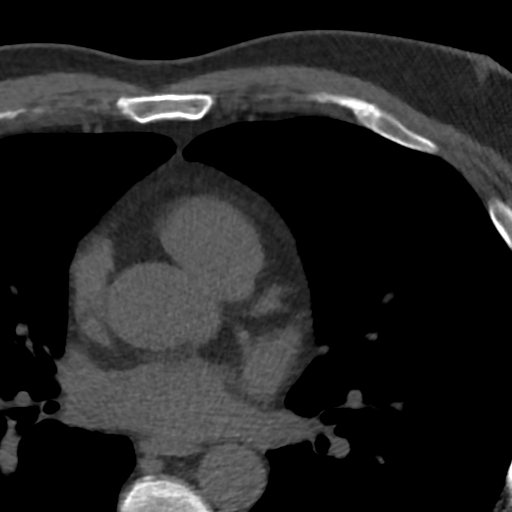
[im 40/45  vessel]
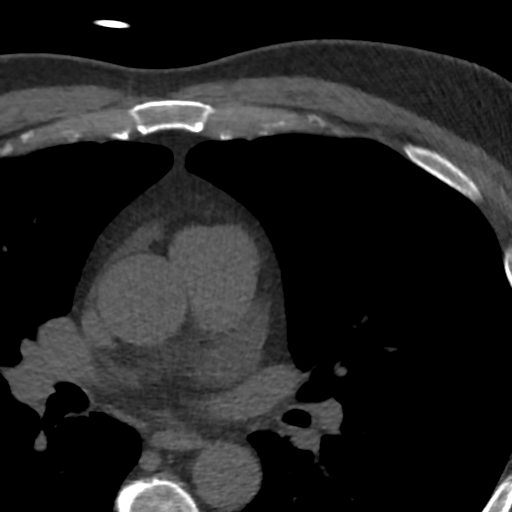

[Series 4: lung st 71 % · axial · 0.73mm/px · z∈[-230,-125]mm · 8 of 45 slices shown]
[im 5/45  lung]
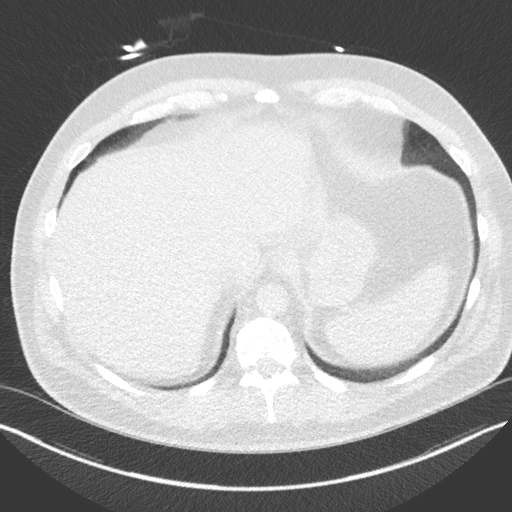
[im 10/45  lung]
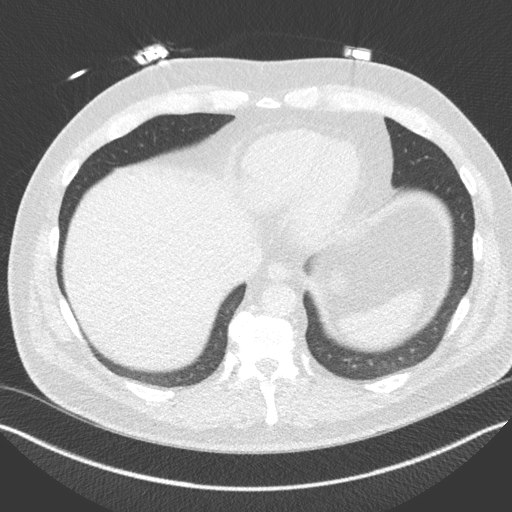
[im 15/45  lung]
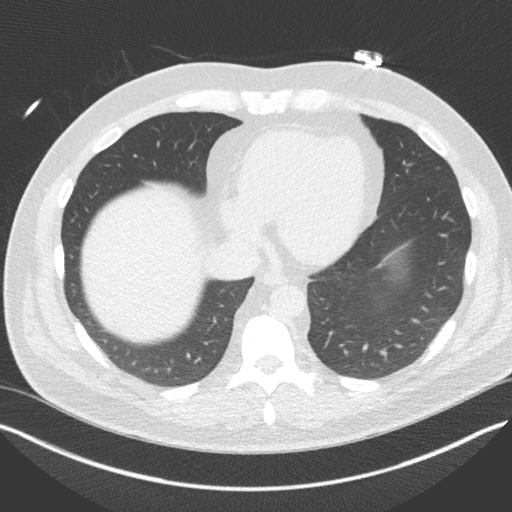
[im 20/45  lung]
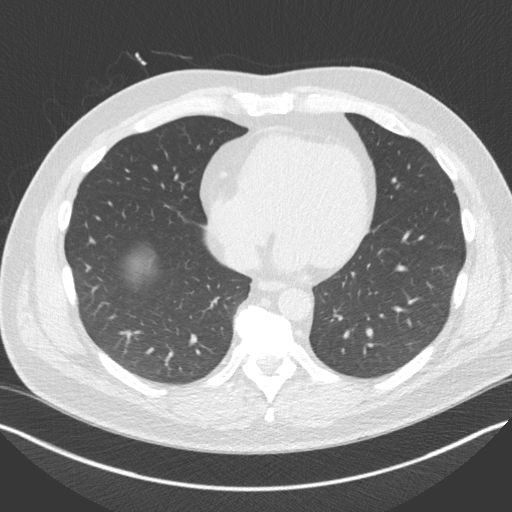
[im 25/45  lung]
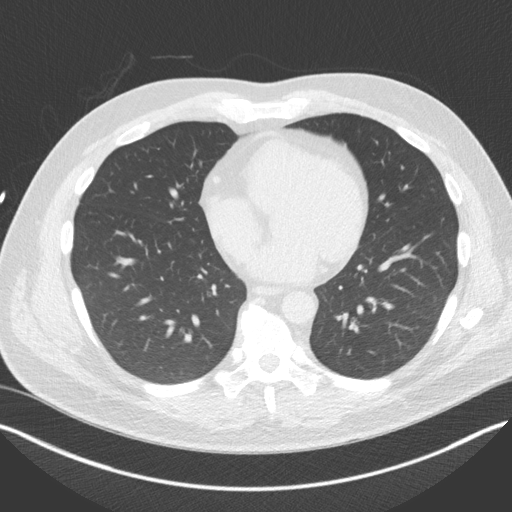
[im 30/45  lung]
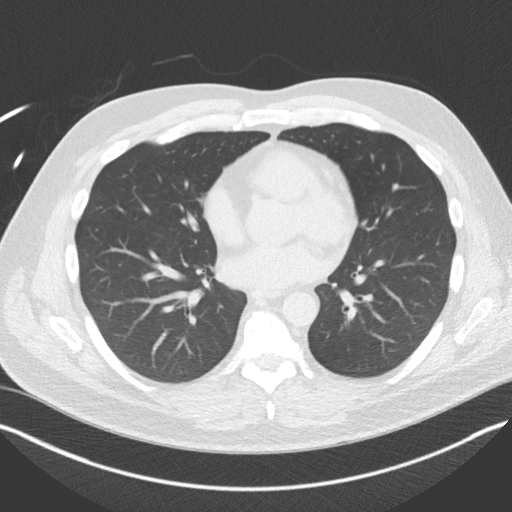
[im 35/45  lung]
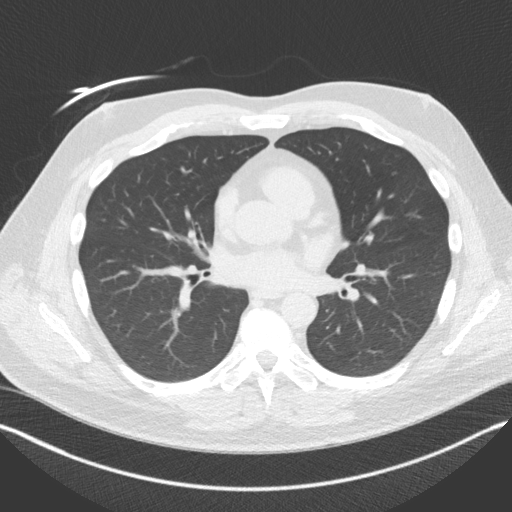
[im 40/45  lung]
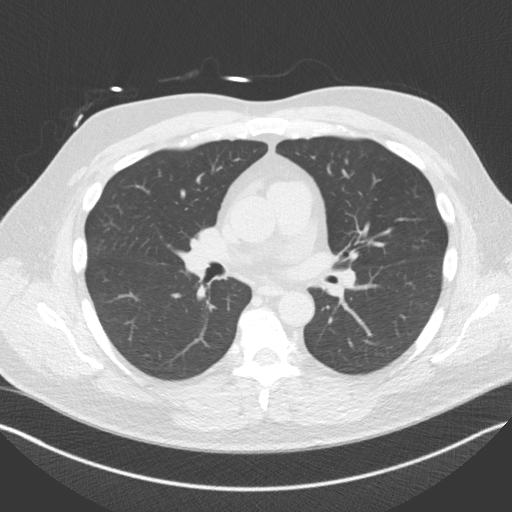

[16 of 20 positions shown; findings below may reference images not displayed]

FINDINGS: Non-cardiac: No significant non cardiac findings on limited lung and
soft tissue windows. See separate report from [REDACTED].

Ascending Aorta:  3.4 cm

Pericardium: Normal

Coronary arteries:  Small isolated calcified plaque in mid LAD
IMPRESSION: Coronary calcium score of 2. This was 31st percentile for age and
sex matched control.

[facility]

EXAM:
OVER-READ INTERPRETATION  CT CHEST

The following report is an over-read performed by radiologist Dr.
over-read does not include interpretation of cardiac or coronary
anatomy or pathology. The coronary calcium score interpretation by
the cardiologist is attached.
FINDINGS: Tiny calcified granuloma in the left upper lobe incidentally noted.
Within the visualized portions of the thorax there are no other
larger more suspicious appearing pulmonary nodules or masses, there
is no acute consolidative airspace disease, no pleural effusions, no
pneumothorax and no lymphadenopathy. Visualized portions of the
upper abdomen are unremarkable. There are no aggressive appearing
lytic or blastic lesions noted in the visualized portions of the
skeleton.
IMPRESSION: 1. No significant incidental noncardiac findings are noted.

## 2017-12-23 ENCOUNTER — Ambulatory Visit (INDEPENDENT_AMBULATORY_CARE_PROVIDER_SITE_OTHER): Payer: Managed Care, Other (non HMO) | Admitting: Family Medicine

## 2017-12-23 ENCOUNTER — Encounter: Payer: Self-pay | Admitting: Family Medicine

## 2017-12-23 VITALS — BP 127/85 | HR 76 | Temp 98.4°F | Resp 16 | Ht 71.0 in | Wt 232.6 lb

## 2017-12-23 DIAGNOSIS — Z23 Encounter for immunization: Secondary | ICD-10-CM

## 2017-12-23 DIAGNOSIS — E669 Obesity, unspecified: Secondary | ICD-10-CM | POA: Diagnosis not present

## 2017-12-23 DIAGNOSIS — E785 Hyperlipidemia, unspecified: Secondary | ICD-10-CM | POA: Diagnosis not present

## 2017-12-23 DIAGNOSIS — Z86010 Personal history of colonic polyps: Secondary | ICD-10-CM

## 2017-12-23 DIAGNOSIS — Z0000 Encounter for general adult medical examination without abnormal findings: Secondary | ICD-10-CM

## 2017-12-23 DIAGNOSIS — Z6832 Body mass index (BMI) 32.0-32.9, adult: Secondary | ICD-10-CM

## 2017-12-23 DIAGNOSIS — Z125 Encounter for screening for malignant neoplasm of prostate: Secondary | ICD-10-CM

## 2017-12-23 NOTE — Progress Notes (Signed)
Patient: Austin Larson, Male    DOB: 1961-01-15, 57 y.o.   MRN: [id number] Visit Date: 12/23/2017  Today's Provider: Lelon Huh, MD   Chief Complaint  Patient presents with  . Annual Exam   Subjective:    Annual physical exam Austin Larson is a 57 y.o. male who presents today for health maintenance and complete physical. He feels well. He reports exercising none. He reports he is sleeping well.  Overall he feels good. He has lost two jobs since he was last seen, but is excited about going back to school to get his doctorate in psychology. Is currently studying for GRE. Is currently working a few part time jobs and has moved in with one of his children. His wife has MS and he reports his stress level is much better in current living arrangement.   -----------------------------------------------------------------   Lipid/Cholesterol, Follow-up:   Last seen for this 2 years ago. Management changes during that visit include recommended patient start Pravastatin and return in 6-8 weeks for OV and labs. Patient preferred to work on lifestyle changes and follow up for labs in 6 weeks. Patient did not follow up, but reports he has been eating much healthier, rarely eats red meats. Stays physically active taking care of grandchildren.   Last Lipid Panel: Lab Results  Component Value Date   CHOL 227 (H) 12/29/2014   HDL 37 (L) 12/29/2014   LDLCALC 150 (H) 12/29/2014   TRIG 202 (H) 12/29/2014   CHOLHDL 6.1 (H) 12/29/2014    Risk factors for vascular disease include hypercholesterolemia and family history.    Current symptoms include none  Weight trend: stable Prior visit with dietician: no Current diet: vegan diet Current exercise: none  Weight trend: stable Wt Readings from Last 3 Encounters:  12/23/17 232 lb 9.6 oz (105.5 kg)  03/03/15 232 lb (105.2 kg)  12/29/14 236 lb (107 kg)   Current diet: vegan diet    Review of Systems  Constitutional: Negative.     HENT: Negative.   Eyes: Negative.   Respiratory: Negative.   Cardiovascular: Negative.   Gastrointestinal: Positive for abdominal pain.  Endocrine: Negative.   Genitourinary: Negative.   Musculoskeletal: Positive for back pain.  Skin: Negative.   Allergic/Immunologic: Negative.   Neurological: Negative.   Hematological: Negative.   Psychiatric/Behavioral: Negative.     Social History      He  reports that he has been smoking cigars. He has never used smokeless tobacco. He reports that he drinks alcohol. He reports that he does not use drugs.       Social History   Socioeconomic History  . Marital status: Married    Spouse name: Not on file  . Number of children: 3  . Years of education: Coll Grad  . Highest education level: Not on file  Occupational History  . Occupation: Investment banker, operational  Social Needs  . Financial resource strain: Not on file  . Food insecurity:    Worry: Not on file    Inability: Not on file  . Transportation needs:    Medical: Not on file    Non-medical: Not on file  Tobacco Use  . Smoking status: Current Some Day Smoker    Types: Cigars  . Smokeless tobacco: Never Used  Substance and Sexual Activity  . Alcohol use: Yes    Alcohol/week: 0.0 standard drinks    Comment: 1 beer every 2 weeks  . Drug use: No  . Sexual activity: Not  on file  Lifestyle  . Physical activity:    Days per week: Not on file    Minutes per session: Not on file  . Stress: Not on file  Relationships  . Social connections:    Talks on phone: Not on file    Gets together: Not on file    Attends religious service: Not on file    Active member of club or organization: Not on file    Attends meetings of clubs or organizations: Not on file    Relationship status: Not on file  Other Topics Concern  . Not on file  Social History Narrative  . Not on file    Past Medical History:  Diagnosis Date  . Chicken pox   . Gallstones      Patient Active Problem List    Diagnosis Date Noted  . Atypical chest pain 03/03/2015  . Dyslipidemia 09/21/2014  . 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 Obesity 09/21/2014  . Seborrhea 09/21/2014  . Rash 09/21/2014  . Tobacco abuse 09/21/2014  . History of adenomatous polyp of colon 04/16/2010  . Family history of ischemic heart disease 08/17/2008  . Fatty liver 10/21/2005  . Gallstones 03/18/2004    Past Surgical History:  Procedure Laterality Date  . APPENDECTOMY  1974  . 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 TONSILLECTOMY  1970    Family History        Family Status  Relation Name Status  . Mother  Deceased at age 7       Breast and Ovarian cancer  . Father  Alive  . Sister ##Sister1 Alive  . Brother ##Brother1 Alive  . Daughter ##Daughter1 Alive  . Daughter ##Daughter2 Alive  . Daughter ##Daughter3 Alive        His family history includes Bipolar disorder in his father; Breast cancer in his mother; Diabetes in his father; Heart disease in his father; Ovarian cancer in his mother.      No Known Allergies   Current Outpatient Medications:  .  aspirin 81 MG tablet, Take by mouth., Disp: , Rfl:  .  Coenzyme Q10 (COQ10 PO), Take 1 tablet by mouth daily., Disp: , Rfl:  .  MULTIPLE VITAMIN PO, Take by mouth., Disp: , Rfl:  .  Omega-3 Fatty Acids (FISH OIL) 1000 MG CAPS, Take by mouth., Disp: , Rfl:    Patient Care Team: Birdie Sons, MD as PCP - General (Family Medicine)      Objective:   Vitals: BP 127/85 (BP Location: Right Arm, Patient Position: Sitting, Cuff Size: Large)   Pulse 76   Temp 98.4 F (36.9 C) (Oral)   Resp 16   Ht 5\' 11"  (1.803 m)   Wt 232 lb 9.6 oz (105.5 kg)   SpO2 95%   BMI 32.44 kg/m    Vitals:   12/23/17 0901  BP: 127/85  Pulse: 76  Resp: 16  Temp: 98.4 F (36.9 C)  TempSrc: Oral  SpO2: 95%  Weight: 232 lb 9.6 oz (105.5 kg)  Height: 5\' 11"  (1.803 m)     Physical Exam   General Appearance:    Alert,  cooperative, no distress, appears stated age  Head:    Normocephalic, without obvious abnormality, atraumatic  Eyes:    PERRL, conjunctiva/corneas clear, EOM's intact, fundi    benign, both eyes       Ears:    Normal TM's and external ear canals, both  ears  Nose:   Nares normal, septum midline, mucosa normal, no drainage   or sinus tenderness  Throat:   Lips, mucosa, and tongue normal; teeth and gums normal  Neck:   Supple, symmetrical, trachea midline, no adenopathy;       thyroid:  No enlargement/tenderness/nodules; no carotid   bruit or JVD  Back:     Symmetric, no curvature, ROM normal, no CVA tenderness  Lungs:     Clear to auscultation bilaterally, respirations unlabored  Chest wall:    No tenderness or deformity  Heart:    Regular rate and rhythm, S1 and S2 normal, no murmur, rub   or gallop  Abdomen:     Soft, non-tender, bowel sounds active all four quadrants,    no masses, no organomegaly  Genitalia:    deferred  Rectal:    deferred  Extremities:   Extremities normal, atraumatic, no cyanosis or edema  Pulses:   2+ and symmetric all extremities  Skin:   Skin color, texture, turgor normal, no rashes or lesions  Lymph nodes:   Cervical, supraclavicular, and axillary nodes normal  Neurologic:   CNII-XII intact. Normal strength, sensation and reflexes      throughout    Depression Screen PHQ 2/9 Scores 12/23/2017 12/29/2014  PHQ - 2 Score 0 0  PHQ- 9 Score 0 0      Assessment & Plan:     Routine Health Maintenance and Physical Exam  Exercise Activities and Dietary recommendations Goals   None     Immunization History  Administered Date(s) Administered  . Influenza,inj,Quad PF,6+ Mos 12/16/2013, 01/25/2016, 12/23/2017  . Tdap 08/17/2008    Health Maintenance  Topic Date Due  . HIV Screening  10/09/1973  . COLONOSCOPY  04/17/2015  . TETANUS/TDAP  08/18/2018  . INFLUENZA VACCINE  Completed  . Hepatitis C Screening  Completed     Discussed health  benefits of physical activity, and encouraged him to engage in regular exercise appropriate for his age and condition.    --------------------------------------------------------------------  1. Annual physical exam  - Comprehensive metabolic panel - Lipid panel - PSA  2. Class 1 obesity without serious comorbidity with body mass index (BMI) of 32.0 to 32.9 in adult, unspecified obesity type Counseled regarding prudent diet and regular exercise.   3. Dyslipidemia Is on much healthier diet since last checked lipids.   4. History of adenomatous polyp of colon He is overdue for follow up colonoscopy.  - Ambulatory referral to Gastroenterology  5. Need for shingles vaccine Shingrix #1  6. Prostate cancer screening  - PSA  7. . Need for influenza vaccination  - Flu Vaccine QUAD 6+ mos PF IM (Fluarix Quad PF)   Lelon Huh, MD  [facility]

## 2018-02-24 ENCOUNTER — Ambulatory Visit: Payer: Managed Care, Other (non HMO) | Admitting: Family Medicine

## 2018-03-02 ENCOUNTER — Ambulatory Visit (INDEPENDENT_AMBULATORY_CARE_PROVIDER_SITE_OTHER): Payer: Managed Care, Other (non HMO) | Admitting: Family Medicine

## 2018-03-02 DIAGNOSIS — Z23 Encounter for immunization: Secondary | ICD-10-CM | POA: Diagnosis not present

## 2018-03-02 NOTE — Progress Notes (Signed)
Nurse Visit only. Administered 2nd Shingrix vaccine.

## 2018-03-25 ENCOUNTER — Encounter: Payer: Self-pay | Admitting: Family Medicine

## 2018-03-25 ENCOUNTER — Telehealth: Payer: Self-pay

## 2018-03-25 LAB — COMPREHENSIVE METABOLIC PANEL WITH GFR
ALT: 22 IU/L (ref 0–44)
AST: 20 IU/L (ref 0–40)
Albumin/Globulin Ratio: 2 (ref 1.2–2.2)
Albumin: 4.3 g/dL (ref 3.5–5.5)
Alkaline Phosphatase: 87 IU/L (ref 39–117)
BUN/Creatinine Ratio: 9 (ref 9–20)
BUN: 12 mg/dL (ref 6–24)
Bilirubin Total: 0.5 mg/dL (ref 0.0–1.2)
CO2: 23 mmol/L (ref 20–29)
Calcium: 9.8 mg/dL (ref 8.7–10.2)
Chloride: 102 mmol/L (ref 96–106)
Creatinine, Ser: 1.31 mg/dL — ABNORMAL HIGH (ref 0.76–1.27)
GFR calc Af Amer: 68 mL/min/1.73 (ref >59)
GFR calc non Af Amer: 59 mL/min/1.73 — ABNORMAL LOW (ref >59)
Globulin, Total: 2.2 g/dL (ref 1.5–4.5)
Glucose: 87 mg/dL (ref 65–99)
Potassium: 4.9 mmol/L (ref 3.5–5.2)
Sodium: 142 mmol/L (ref 134–144)
Total Protein: 6.5 g/dL (ref 6.0–8.5)

## 2018-03-25 LAB — PSA: Prostate Specific Ag, Serum: 1.9 ng/mL (ref 0.0–4.0)

## 2018-03-25 LAB — LIPID PANEL
Chol/HDL Ratio: 5.5 ratio — ABNORMAL HIGH (ref 0.0–5.0)
Cholesterol, Total: 203 mg/dL — ABNORMAL HIGH (ref 100–199)
HDL: 37 mg/dL — ABNORMAL LOW (ref >39)
LDL Calculated: 130 mg/dL — ABNORMAL HIGH (ref 0–99)
Triglycerides: 179 mg/dL — ABNORMAL HIGH (ref 0–149)
VLDL Cholesterol Cal: 36 mg/dL (ref 5–40)

## 2018-03-25 NOTE — Telephone Encounter (Signed)
Pt advised.   Thanks,   -Tayden Duran  

## 2018-03-25 NOTE — Telephone Encounter (Signed)
-----   Message from Birdie Sons, MD sent at 03/25/2018  7:37 AM EST ----- Cholesterol is a a little high at 203, but improved from last year. Rest of labs including psa are normal. Check yearly.

## 2018-09-03 ENCOUNTER — Encounter: Payer: Self-pay | Admitting: Family Medicine

## 2019-06-03 ENCOUNTER — Ambulatory Visit: Payer: 59 | Attending: Internal Medicine

## 2019-06-03 DIAGNOSIS — Z23 Encounter for immunization: Secondary | ICD-10-CM

## 2019-06-03 NOTE — Progress Notes (Signed)
   Covid-19 Vaccination Clinic  Name:  Austin Larson    MRN: [id number] DOB: [birth year]  06/03/2019  Austin Larson was observed post Covid-19 immunization for 15 minutes without incident. He was provided with Vaccine Information Sheet and instruction to access the V-Safe system.   Austin Larson was instructed to call 911 with any severe reactions post vaccine: Marland Kitchen Difficulty breathing  . Swelling of face and throat  . A fast heartbeat  . A bad rash all over body  . Dizziness and weakness   Immunizations Administered    Name Date Dose VIS Date Route   Pfizer COVID-19 Vaccine 06/03/2019  9:17 AM 0.3 mL 02/26/2019 Intramuscular   Manufacturer: [place]   Lot: [id number]   [place]: [id number]

## 2019-06-07 ENCOUNTER — Ambulatory Visit: Payer: 59

## 2019-06-29 ENCOUNTER — Ambulatory Visit: Payer: 59 | Attending: Internal Medicine

## 2019-06-29 DIAGNOSIS — Z23 Encounter for immunization: Secondary | ICD-10-CM

## 2019-06-29 NOTE — Progress Notes (Signed)
   Covid-19 Vaccination Clinic  Name:  Austin Larson    MRN: [id number] DOB: 19-Jan-1961  06/29/2019  [name] was observed post Covid-19 immunization for 15 minutes without incident. He was provided with Vaccine Information Sheet and instruction to access the V-Safe system.   [name] was instructed to call 911 with any severe reactions post vaccine: Marland Kitchen Difficulty breathing  . Swelling of face and throat  . A fast heartbeat  . A bad rash all over body  . Dizziness and weakness   Immunizations Administered    Name Date Dose VIS Date Route   Pfizer COVID-19 Vaccine 06/29/2019  8:55 AM 0.3 mL 02/26/2019 Intramuscular   Manufacturer: [place]   Lot: [id number]   [place]: [id number]

## 2019-08-18 ENCOUNTER — Ambulatory Visit: Payer: Managed Care, Other (non HMO) | Admitting: Family Medicine

## 2019-08-23 ENCOUNTER — Other Ambulatory Visit: Payer: Self-pay

## 2019-08-23 ENCOUNTER — Encounter: Payer: Self-pay | Admitting: Family Medicine

## 2019-08-23 ENCOUNTER — Ambulatory Visit: Payer: Managed Care, Other (non HMO) | Admitting: Family Medicine

## 2019-08-23 VITALS — BP 138/88 | HR 62 | Temp 97.1°F | Resp 12 | Ht 71.0 in | Wt 230.4 lb

## 2019-08-23 DIAGNOSIS — R1084 Generalized abdominal pain: Secondary | ICD-10-CM | POA: Diagnosis not present

## 2019-08-23 NOTE — Progress Notes (Signed)
Established patient visit   Patient: Austin Larson   DOB: 07/22/1960   59 y.o. Male  MRN: [id number] Visit Date: 08/23/2019  Today's healthcare provider: Lelon Huh, MD   Chief Complaint  Patient presents with  . Abdominal Pain    Abd pain that radiates into back. Hurts more after eating meals. No fevers. Says he has had this several times before on and off. Pt does have hx of gall stones. Right now its hurting in [place]. Difficulty getting in and up ( standing. ) Feels like something is "swollen and putting pressure on guts." Nauseous on and off.    Subjective    Abd pain that radiates into back. Hurts more after eating meals. No fevers. Says he has had this several times before on and off. Pt does have hx of gall stones. Right now its hurting in right upper quadrant. Difficulty getting in and up ( standing. ) Feels like something is "swollen and putting pressure on guts." Nauseous on and off. He had ultrasound in 2011 showing fatty liver and cholelithiasis. Has had similar episodes off and one for years, but recently have been much more persistent. Pain usually starts in lower back, sometimes on right and sometimes on left,  and migrates around to right side of abdomen or epigastrium. Is not stabbing or burning, just an ache.   Past Medical History:  Diagnosis Date  . Chicken pox   . Gallstones    Past Surgical History:  Procedure Laterality Date  . APPENDECTOMY  1974  . 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 TONSILLECTOMY  1970   Social History   Tobacco Use  . Smoking status: Current Some Day Smoker    Types: Cigars  . Smokeless tobacco: Never Used  Substance Use Topics  . Alcohol use: Yes    Alcohol/week: 0.0 standard drinks    Comment: 1 beer every 2 weeks  . Drug use: No       Medications: Outpatient Medications Prior to Visit  Medication Sig  . aspirin 81 MG tablet Take by mouth.  . Coenzyme Q10 (COQ10 PO) Take 1 tablet by mouth  daily.  . MULTIPLE VITAMIN PO Take by mouth.  . Omega-3 Fatty Acids (FISH OIL) 1000 MG CAPS Take by mouth.   No facility-administered medications prior to visit.    Review of Systems  Constitutional: Negative for appetite change, chills and fever.  Respiratory: Negative for chest tightness, shortness of breath and wheezing.   Cardiovascular: Negative for chest pain and palpitations.  Gastrointestinal: Positive for abdominal pain. Negative for nausea and vomiting.    Last CBC Lab Results  Component Value Date   WBC 9.4 02/18/2014   HGB 15.5 02/18/2014   HCT 42 02/18/2014   PLT 250 70/96/2836   Last metabolic panel Lab Results  Component Value Date   GLUCOSE 87 03/24/2018   NA 142 03/24/2018   K 4.9 03/24/2018   CL 102 03/24/2018   CO2 23 03/24/2018   BUN 12 03/24/2018   CREATININE 1.31 (H) 03/24/2018   GFRNONAA 59 (L) 03/24/2018   GFRAA 68 03/24/2018   CALCIUM 9.8 03/24/2018   PROT 6.5 03/24/2018   ALBUMIN 4.3 03/24/2018   LABGLOB 2.2 03/24/2018   AGRATIO 2.0 03/24/2018   BILITOT 0.5 03/24/2018   ALKPHOS 87 03/24/2018   AST 20 03/24/2018   ALT 22 03/24/2018      Objective    BP 138/88 (BP Location: Right  Arm, Patient Position: Sitting, Cuff Size: Large)   Pulse 62   Temp (!) 97.1 F (36.2 C) (Temporal)   Resp 12   Ht 5\' 11"  (1.803 m)   Wt 230 lb 6.4 oz (104.5 kg)   BMI 32.13 kg/m   Physical Exam  General Appearance:    Obese male, alert, cooperative, in no acute distress  Eyes:    PERRL, conjunctiva/corneas clear, EOM's intact       Lungs:     Clear to auscultation bilaterally, respirations unlabored  Heart:    Normal heart rate. Normal rhythm. No murmurs, rubs, or gallops.   Abdomen:   bowel sounds present and normal in all 4 quadrants, soft, round or Tender RUQ and epigatrsium. No CVA tenderness       Assessment & Plan     1. Generalized abdominal pain  - CBC - Comprehensive metabolic panel - Amylase - CT Abdomen Pelvis W Contrast;  Future   No follow-ups on file.      The entirety of the information documented in the History of Present Illness, Review of Systems and Physical Exam were personally obtained by me. Portions of this information were initially documented by the CMA and reviewed by me for thoroughness and accuracy.      Lelon Huh, MD  [facility] [phone] (phone) [phone] (fax)  [place]

## 2019-08-24 LAB — CBC
Hematocrit: 46.2 % (ref 37.5–51.0)
Hemoglobin: 15.8 g/dL (ref 13.0–17.7)
MCH: 29.3 pg (ref 26.6–33.0)
MCHC: 34.2 g/dL (ref 31.5–35.7)
MCV: 86 fL (ref 79–97)
Platelets: 244 10*3/uL (ref 150–450)
RBC: 5.4 x10E6/uL (ref 4.14–5.80)
RDW: 13.5 % (ref 11.6–15.4)
WBC: 9.2 10*3/uL (ref 3.4–10.8)

## 2019-08-24 LAB — COMPREHENSIVE METABOLIC PANEL
ALT: 20 IU/L (ref 0–44)
AST: 18 IU/L (ref 0–40)
Albumin/Globulin Ratio: 1.9 (ref 1.2–2.2)
Albumin: 4.2 g/dL (ref 3.8–4.9)
Alkaline Phosphatase: 86 IU/L (ref 48–121)
BUN/Creatinine Ratio: 12 (ref 10–24)
BUN: 15 mg/dL (ref 8–27)
Bilirubin Total: 0.6 mg/dL (ref 0.0–1.2)
CO2: 22 mmol/L (ref 20–29)
Calcium: 9.9 mg/dL (ref 8.6–10.2)
Chloride: 103 mmol/L (ref 96–106)
Creatinine, Ser: 1.3 mg/dL — ABNORMAL HIGH (ref 0.76–1.27)
GFR calc Af Amer: 69 mL/min/{1.73_m2} (ref >59)
GFR calc non Af Amer: 59 mL/min/{1.73_m2} — ABNORMAL LOW (ref >59)
Globulin, Total: 2.2 g/dL (ref 1.5–4.5)
Glucose: 79 mg/dL (ref 65–99)
Potassium: 4.4 mmol/L (ref 3.5–5.2)
Sodium: 143 mmol/L (ref 134–144)
Total Protein: 6.4 g/dL (ref 6.0–8.5)

## 2019-08-24 LAB — AMYLASE: Amylase: 82 U/L (ref 31–110)

## 2019-08-30 ENCOUNTER — Telehealth: Payer: Self-pay | Admitting: Family Medicine

## 2019-08-30 DIAGNOSIS — R1084 Generalized abdominal pain: Secondary | ICD-10-CM

## 2019-08-30 NOTE — Telephone Encounter (Signed)
Insurance company will not approve the CT abd/pelvis because pt has not had an ultrasound done

## 2019-08-31 NOTE — Telephone Encounter (Signed)
Order placed for abdominal ultrasound.   

## 2019-09-02 ENCOUNTER — Encounter: Payer: Self-pay | Admitting: Family Medicine

## 2019-09-08 ENCOUNTER — Ambulatory Visit: Payer: 59

## 2019-09-13 ENCOUNTER — Other Ambulatory Visit: Payer: Self-pay

## 2019-09-13 ENCOUNTER — Ambulatory Visit
Admission: RE | Admit: 2019-09-13 | Discharge: 2019-09-13 | Disposition: A | Discharge status: Home / Self Care | Payer: Managed Care, Other (non HMO) | Source: Ambulatory Visit | Attending: Family Medicine | Admitting: Family Medicine

## 2019-09-13 DIAGNOSIS — R1084 Generalized abdominal pain: Secondary | ICD-10-CM | POA: Diagnosis present

## 2019-09-14 ENCOUNTER — Other Ambulatory Visit: Payer: Self-pay | Admitting: Family Medicine

## 2019-09-14 DIAGNOSIS — R1084 Generalized abdominal pain: Secondary | ICD-10-CM

## 2019-09-15 NOTE — Telephone Encounter (Signed)
1st time order was placed it was for abd/pelvis. It is in now just for abd.Is this the test you want for pt ?

## 2019-09-28 ENCOUNTER — Encounter: Payer: Self-pay | Admitting: Family Medicine

## 2019-09-28 ENCOUNTER — Ambulatory Visit (INDEPENDENT_AMBULATORY_CARE_PROVIDER_SITE_OTHER): Payer: Managed Care, Other (non HMO) | Admitting: Family Medicine

## 2019-09-28 ENCOUNTER — Other Ambulatory Visit: Payer: Self-pay

## 2019-09-28 VITALS — BP 138/88 | HR 71 | Temp 97.5°F | Ht 71.0 in | Wt 230.6 lb

## 2019-09-28 DIAGNOSIS — Z1211 Encounter for screening for malignant neoplasm of colon: Secondary | ICD-10-CM

## 2019-09-28 DIAGNOSIS — Z0000 Encounter for general adult medical examination without abnormal findings: Secondary | ICD-10-CM

## 2019-09-28 DIAGNOSIS — H6123 Impacted cerumen, bilateral: Secondary | ICD-10-CM | POA: Diagnosis not present

## 2019-09-28 DIAGNOSIS — Z125 Encounter for screening for malignant neoplasm of prostate: Secondary | ICD-10-CM

## 2019-09-28 DIAGNOSIS — Z114 Encounter for screening for human immunodeficiency virus [HIV]: Secondary | ICD-10-CM | POA: Diagnosis not present

## 2019-09-28 DIAGNOSIS — Z23 Encounter for immunization: Secondary | ICD-10-CM | POA: Diagnosis not present

## 2019-09-28 DIAGNOSIS — E785 Hyperlipidemia, unspecified: Secondary | ICD-10-CM

## 2019-09-28 DIAGNOSIS — Z8249 Family history of ischemic heart disease and other diseases of the circulatory system: Secondary | ICD-10-CM

## 2019-09-28 NOTE — Progress Notes (Signed)
Complete physical exam   Patient: Austin Larson   DOB: 1960-06-07   59 y.o. Male  MRN: [id number] Visit Date: 09/28/2019  Today's healthcare provider: Lelon Huh, MD   Chief Complaint  Patient presents with  . Annual Exam   [employer] as a scribe for Lelon Huh, MD.,have documented all relevant documentation on the behalf of Lelon Huh, MD,as directed by  Lelon Huh, MD while in the presence of Lelon Huh, MD.  Subjective    Austin Larson is a 59 y.o. male who presents today for a complete physical exam.  He reports consuming a general diet. He reports going to the gym and doing yard work for exercise. He generally feels fairly well. He reports sleeping well. He does have additional problems to discuss today. Patient C/O shoulder pain and ear fullness. HPI  Lipid/Cholesterol, Follow-up  Last lipid panel Other pertinent labs  Lab Results  Component Value Date   CHOL 203 (H) 03/24/2018   HDL 37 (L) 03/24/2018   LDLCALC 130 (H) 03/24/2018   TRIG 179 (H) 03/24/2018   CHOLHDL 5.5 (H) 03/24/2018   Lab Results  Component Value Date   ALT 20 08/23/2019   AST 18 08/23/2019   PLT 244 08/23/2019   TSH 0.661 12/29/2014     He was last seen for this 12/23/2017  Management since that visit includes no change.  He reports good compliance with treatment. He is not having side effects.   Symptoms: No chest pain No chest pressure/discomfort  No dyspnea No lower extremity edema  No numbness or tingling of extremity No orthopnea  No palpitations No paroxysmal nocturnal dyspnea  No speech difficulty No syncope   Current diet: well balanced Current exercise: cardiovascular workout on exercise equipment and yard work  The 10-year ASCVD risk score Mikey Bussing DC Brooke Bonito., et al., 2013) is: 19.2%  --------------------------------------------------------------------------------------------------- Also complains of muffling sound in right ear since he was  cleaning out ears a few weeks ago.   Also complains of right shoulder pain.   Past Medical History:  Diagnosis Date  . Chicken pox   . Gallstones    Past Surgical History:  Procedure Laterality Date  . APPENDECTOMY  1974  . 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 TONSILLECTOMY  1970   Social History   Socioeconomic History  . Marital status: Married    Spouse name: Not on file  . Number of children: 3  . Years of education: Coll Grad  . Highest education level: Not on file  Occupational History  . Occupation: Investment banker, operational  Tobacco Use  . Smoking status: Former Smoker    Types: Cigars  . Smokeless tobacco: Never Used  . Tobacco comment: quit cigars around 2016  Substance and Sexual Activity  . Alcohol use: Yes    Alcohol/week: 0.0 standard drinks    Comment: 1 beer every 2 weeks  . Drug use: No  . Sexual activity: Not on file  Other Topics Concern  . Not on file  Social History Narrative  . Not on file   Social Determinants of Health   Financial Resource Strain:   . Difficulty of Paying Living Expenses:   Food Insecurity:   . Worried About Charity fundraiser in the Last Year:   . Arboriculturist in the Last Year:   Transportation Needs:   . Film/video editor (Medical):   Marland Kitchen Lack of Transportation (Non-Medical):  Physical Activity:   . Days of Exercise per Week:   . Minutes of Exercise per Session:   Stress:   . Feeling of Stress :   Social Connections:   . Frequency of Communication with Friends and Family:   . Frequency of Social Gatherings with Friends and Family:   . Attends Religious Services:   . Active Member of Clubs or Organizations:   . Attends Archivist Meetings:   Marland Kitchen Marital Status:   Intimate Partner Violence:   . Fear of Current or Ex-Partner:   . Emotionally Abused:   Marland Kitchen Physically Abused:   . Sexually Abused:    Family Status  Relation Name Status  . Mother  Deceased at age 52        Breast and Ovarian cancer  . Father  Alive  . Sister  Alive  . Brother  Alive  . Daughter  Alive  . Daughter  Alive  . Daughter  Alive   Family History  Problem Relation Age of Onset  . Ovarian cancer Mother   . Breast cancer Mother   . Diabetes Father   . Heart disease Father   . Bipolar disorder Father    No Known Allergies  Patient Care Team: Birdie Sons, MD as PCP - General (Family Medicine)   Medications: Outpatient Medications Prior to Visit  Medication Sig  . MULTIPLE VITAMIN PO Take by mouth.  . Coenzyme Q10 (COQ10 PO) Take 1 tablet by mouth daily. (Patient not taking: Reported on 09/28/2019)  . Omega-3 Fatty Acids (FISH OIL) 1000 MG CAPS Take by mouth. (Patient not taking: Reported on 09/28/2019)   No facility-administered medications prior to visit.     Review of Systems  Constitutional: Negative.   HENT: Positive for hearing loss. Ear pain: ear fullness.   Eyes: Negative.   Respiratory: Negative.   Cardiovascular: Negative.   Gastrointestinal: Negative.   Endocrine: Negative.   Genitourinary: Negative.   Musculoskeletal: Positive for back pain. Arthralgias: shoulder pain   Allergic/Immunologic: Negative.   Neurological: Negative.   Hematological: Negative.   Psychiatric/Behavioral: Negative.        Objective    BP 138/88 (BP Location: Right Arm, Patient Position: Sitting, Cuff Size: Large)   Pulse 71   Temp (!) 97.5 F (36.4 C) (Temporal)   Ht '5\' 11"'$  (1.803 m)   Wt 230 lb 9.6 oz (104.6 kg)   BMI 32.16 kg/m     Physical Exam  BP 138/88 (BP Location: Right Arm, Patient Position: Sitting, Cuff Size: Large)   Pulse 71   Temp (!) 97.5 F (36.4 C) (Temporal)   Ht '5\' 11"'$  (1.803 m)   Wt 230 lb 9.6 oz (104.6 kg)   BMI 32.16 kg/m    General Appearance:    Obese male. Alert, cooperative, in no acute distress, appears stated age  Head:    Normocephalic, without obvious abnormality, atraumatic  Eyes:    PERRL, conjunctiva/corneas clear, EOM's  intact, fundi    benign, both eyes       Ears:    Both ear canals obstructed  Neck:   Supple, symmetrical, trachea midline, no adenopathy;       thyroid:  No enlargement/tenderness/nodules; no carotid   bruit or JVD  Back:     Symmetric, no curvature, ROM normal, no CVA tenderness  Lungs:     Clear to auscultation bilaterally, respirations unlabored  Chest wall:    No tenderness or deformity  Heart:    Normal  heart rate. Normal rhythm. No murmurs, rubs, or gallops.  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 No cyanosis or edema  Pulses:   2+ and symmetric all extremities  Skin:   Skin color, texture, turgor normal, no rashes or lesions  Lymph nodes:   Cervical, supraclavicular, and axillary nodes normal  Neurologic:   CNII-XII intact. Normal strength, sensation and reflexes      throughout     Last depression screening scores PHQ 2/9 Scores 09/28/2019 08/23/2019 12/23/2017  PHQ - 2 Score 0 0 0  PHQ- 9 Score - 0 0   Last fall risk screening Fall Risk  09/28/2019  Falls in the past year? 0  Number falls in past yr: 0  Injury with Fall? 0  Risk for fall due to : -  Follow up -   Last Audit-C alcohol use screening Alcohol Use Disorder Test (AUDIT) 09/28/2019  1. How often do you have a drink containing alcohol? 1  2. How many drinks containing alcohol do you have on a typical day when you are drinking? 0  3. How often do you have six or more drinks on one occasion? 0  AUDIT-C Score 1  Alcohol Brief Interventions/Follow-up AUDIT Score <7 follow-up not indicated   A score of 3 or more in women, and 4 or more in men indicates increased risk for alcohol abuse, EXCEPT if all of the points are from question 1   No results found for any visits on 09/28/19.  Assessment & Plan    Routine Health  Maintenance and Physical Exam  Exercise Activities and Dietary recommendations Goals   None     Immunization History  Administered Date(s) Administered  . Influenza,inj,Quad PF,6+ Mos 12/16/2013, 01/25/2016, 12/23/2017  . MMR 04/06/1987  . PFIZER SARS-COV-2 Vaccination 06/03/2019, 06/29/2019  . Tdap 08/17/2008, 09/28/2019  . Zoster Recombinat (Shingrix) 12/23/2017, 03/02/2018    Health Maintenance  Topic Date Due  . HIV Screening  Never done  . COLONOSCOPY  04/16/2013  . INFLUENZA VACCINE  10/17/2019  . TETANUS/TDAP  09/27/2029  . COVID-19 Vaccine  Completed  . Hepatitis C Screening  Completed    Discussed health benefits of physical activity, and encouraged him to engage in regular exercise appropriate for his age and condition.  1. Annual physical exam  - Lipid panel - Hemoglobin A1c - SAR CoV2 Serology (COVID 19)AB(IGG)IA  2. Colon cancer screening  - Ambulatory referral to Gastroenterology  3. Need for tetanus booster  - Tdap vaccine greater than or equal to 7yo IM  4. Encounter for screening for HIV  - HIV Antibody (routine testing w rflx)  5. Prostate cancer screening  - PSA Total (Reflex To Free) ([facility] only)  6. Bilateral impacted cerumen After soaking with Debrox, ear canals were irrigated with water until clear. Patient tolerated procedure well and reports that hearing was back to baseline after irrigation.   7. Dyslipidemia Counseled patient on CAD risk and he does not want to take statins despite risks and family history. Is interested in Coronary Calcium Scan and will consider having done.   8. Family history of ischemic heart disease        The entirety of the information documented in the History of Present Illness, Review of  Systems and Physical Exam were personally obtained by me. Portions of this information were initially documented by the CMA and reviewed by me for thoroughness and accuracy.      Lelon Huh, MD  [facility] [phone] (phone) [phone] (fax)  [place]

## 2019-10-01 LAB — LIPID PANEL
Chol/HDL Ratio: 5.6 ratio — ABNORMAL HIGH (ref 0.0–5.0)
Cholesterol, Total: 209 mg/dL — ABNORMAL HIGH (ref 100–199)
HDL: 37 mg/dL — ABNORMAL LOW (ref >39)
LDL Chol Calc (NIH): 131 mg/dL — ABNORMAL HIGH (ref 0–99)
Triglycerides: 229 mg/dL — ABNORMAL HIGH (ref 0–149)
VLDL Cholesterol Cal: 41 mg/dL — ABNORMAL HIGH (ref 5–40)

## 2019-10-01 LAB — HEMOGLOBIN A1C
Est. average glucose Bld gHb Est-mCnc: 114 mg/dL
Hgb A1c MFr Bld: 5.6 % (ref 4.8–5.6)

## 2019-10-01 LAB — SAR COV2 SEROLOGY (COVID19)AB(IGG),IA: DiaSorin SARS-CoV-2 Ab, IgG: POSITIVE

## 2019-10-01 LAB — PSA TOTAL (REFLEX TO FREE): Prostate Specific Ag, Serum: 2.6 ng/mL (ref 0.0–4.0)

## 2019-10-01 LAB — HIV ANTIBODY (ROUTINE TESTING W REFLEX): HIV Screen 4th Generation wRfx: NONREACTIVE

## 2019-10-04 ENCOUNTER — Telehealth: Payer: Self-pay

## 2019-10-04 ENCOUNTER — Telehealth: Payer: Managed Care, Other (non HMO)

## 2019-10-04 NOTE — Telephone Encounter (Signed)
Missed nurse call to schedule colonoscopy.  LVM for him to call office back to reschedule this call.  Thanks,  [place], [place]

## 2019-10-07 ENCOUNTER — Other Ambulatory Visit: Payer: Self-pay

## 2019-10-07 ENCOUNTER — Ambulatory Visit
Admission: RE | Admit: 2019-10-07 | Discharge: 2019-10-07 | Disposition: A | Discharge status: Home / Self Care | Payer: Managed Care, Other (non HMO) | Source: Ambulatory Visit | Attending: Family Medicine | Admitting: Family Medicine

## 2019-10-07 DIAGNOSIS — R1084 Generalized abdominal pain: Secondary | ICD-10-CM | POA: Diagnosis present

## 2019-10-07 LAB — POCT I-STAT CREATININE: Creatinine, Ser: 1.4 mg/dL — ABNORMAL HIGH (ref 0.61–1.24)

## 2019-10-07 MED ORDER — IOHEXOL 300 MG/ML  SOLN
100.0000 mL | Freq: Once | INTRAMUSCULAR | Status: AC | PRN
  Administered 2019-10-07: 14:00:00 100 mL via INTRAVENOUS

## 2019-12-08 ENCOUNTER — Encounter: Payer: Self-pay | Admitting: Family Medicine

## 2020-04-03 ENCOUNTER — Ambulatory Visit: Payer: Managed Care, Other (non HMO)

## 2020-04-10 ENCOUNTER — Ambulatory Visit: Payer: Managed Care, Other (non HMO) | Attending: Internal Medicine

## 2020-04-10 DIAGNOSIS — Z23 Encounter for immunization: Secondary | ICD-10-CM

## 2020-04-10 NOTE — Progress Notes (Signed)
   Covid-19 Vaccination Clinic  Name:  Austin Larson    MRN: [id number] DOB: 1960-07-26  04/10/2020  [name] was observed post Covid-19 immunization for 15 minutes without incident. He was provided with Vaccine Information Sheet and instruction to access the V-Safe system.   [name] was instructed to call 911 with any severe reactions post vaccine: Marland Kitchen Difficulty breathing  . Swelling of face and throat  . A fast heartbeat  . A bad rash all over body  . Dizziness and weakness   Immunizations Administered    Name Date Dose VIS Date Route   PFIZER Comrnaty(Gray TOP) Covid-19 Vaccine 04/10/2020  1:16 PM 0.3 mL 02/24/2020 Intramuscular   Manufacturer: [place]   Lot: [id number]   [place]: [phone]

## 2020-04-13 ENCOUNTER — Other Ambulatory Visit: Payer: Managed Care, Other (non HMO)

## 2020-04-13 DIAGNOSIS — Z20822 Contact with and (suspected) exposure to covid-19: Secondary | ICD-10-CM

## 2020-04-14 LAB — SARS-COV-2, NAA 2 DAY TAT

## 2020-04-14 LAB — NOVEL CORONAVIRUS, NAA: SARS-CoV-2, NAA: NOT DETECTED

## 2020-04-17 ENCOUNTER — Other Ambulatory Visit: Payer: Managed Care, Other (non HMO)

## 2020-04-17 DIAGNOSIS — Z20822 Contact with and (suspected) exposure to covid-19: Secondary | ICD-10-CM

## 2020-04-19 ENCOUNTER — Encounter: Payer: Self-pay | Admitting: Family Medicine

## 2020-04-19 ENCOUNTER — Ambulatory Visit: Payer: Self-pay | Admitting: *Deleted

## 2020-04-19 LAB — NOVEL CORONAVIRUS, NAA: SARS-CoV-2, NAA: DETECTED — AB

## 2020-04-19 LAB — SARS-COV-2, NAA 2 DAY TAT

## 2020-04-19 NOTE — Telephone Encounter (Signed)
My covid test is positive. I saw it on MyChart.   My whole family is sick with it.   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  Is that true?   I let him know yes that is true.   He is going to retest on Feb. 7 next Monday and take a chance it's negative by then. Since his whole family is sick with covid he said he understood the quarantine and all and didn't need me to go over it with him.   He is feeling much better today.  I made sure he didn't have any questions pertaining to the quarantine protocol and he said,  "No I'm very aware of it since my whole family is sick with covid".  He was on a call with another nurse and accidentally hung up on her.    I will check that nurse's charting and see if the health dept is being notified.  If not I will notify [place] Dept.

## 2020-04-25 ENCOUNTER — Telehealth: Payer: Self-pay

## 2020-04-25 NOTE — Telephone Encounter (Signed)
Please advise is ok to generate note for patient to return to work after having [place].

## 2020-04-25 NOTE — Telephone Encounter (Signed)
Copied from [place] [id number]. Topic: General - Other >> Apr 25, 2020  9:26 AM Keene Breath wrote: Reason for CRM: Patient stated he needs a letter stating he can return to work after having covid.  Please advise and call patient to see how he can get this letter.  CB# [phone]

## 2020-04-26 ENCOUNTER — Encounter: Payer: Self-pay | Admitting: Family Medicine

## 2020-04-26 NOTE — Telephone Encounter (Signed)
Ok for note to return to work 10 days after onset of covid sx.

## 2020-04-26 NOTE — Telephone Encounter (Signed)
Copied from [place] [id number]. Topic: General - Other >> Apr 25, 2020  9:26 AM Keene Breath wrote: Reason for CRM: Patient stated he needs a letter stating he can return to work after having covid.  Please advise and call patient to see how he can get this letter.  CB# [phone] >> Apr 26, 2020  3:26 PM Keene Breath wrote: Patient is requesting a written note that he can give to his employer.  Pease advise and let patient know how he could get this written note.

## 2020-04-27 ENCOUNTER — Encounter: Payer: Self-pay | Admitting: Family Medicine

## 2020-04-27 NOTE — Telephone Encounter (Signed)
Letter has been completed and patient was notified through [employer]

## 2020-04-27 NOTE — Telephone Encounter (Signed)
Patient sent several messages through [employer] regarding note for work.  I have called and sked him for the dates he has been out of work.    LMTCB, [employer] Triage Nurse may give patient results

## 2020-12-22 ENCOUNTER — Ambulatory Visit: Payer: Managed Care, Other (non HMO)

## 2020-12-26 ENCOUNTER — Ambulatory Visit: Payer: Managed Care, Other (non HMO)

## 2021-04-18 HISTORY — PX: ROBOTIC ASSISTED LAPAROSCOPIC CHOLECYSTECTOMY: SHX6521

## 2021-05-02 DIAGNOSIS — R1013 Epigastric pain: Secondary | ICD-10-CM | POA: Diagnosis present

## 2021-05-02 DIAGNOSIS — Z87891 Personal history of nicotine dependence: Secondary | ICD-10-CM | POA: Insufficient documentation

## 2021-05-02 DIAGNOSIS — K8012 Calculus of gallbladder with acute and chronic cholecystitis without obstruction: Secondary | ICD-10-CM | POA: Diagnosis not present

## 2021-05-02 DIAGNOSIS — Z20822 Contact with and (suspected) exposure to covid-19: Secondary | ICD-10-CM | POA: Insufficient documentation

## 2021-05-03 ENCOUNTER — Other Ambulatory Visit: Payer: Self-pay

## 2021-05-03 ENCOUNTER — Encounter
Admission: EM | Disposition: A | Discharge status: Home / Self Care | Payer: Self-pay | Source: Home / Self Care | Attending: Emergency Medicine

## 2021-05-03 ENCOUNTER — Observation Stay: Payer: Managed Care, Other (non HMO) | Admitting: Anesthesiology

## 2021-05-03 ENCOUNTER — Encounter: Payer: Self-pay | Admitting: Emergency Medicine

## 2021-05-03 ENCOUNTER — Emergency Department: Payer: Managed Care, Other (non HMO)

## 2021-05-03 ENCOUNTER — Observation Stay
Admission: EM | Admit: 2021-05-03 | Discharge: 2021-05-04 | Disposition: A | Discharge status: Home / Self Care | Payer: Managed Care, Other (non HMO) | Attending: General Surgery | Admitting: General Surgery

## 2021-05-03 DIAGNOSIS — R1013 Epigastric pain: Secondary | ICD-10-CM

## 2021-05-03 DIAGNOSIS — R109 Unspecified abdominal pain: Secondary | ICD-10-CM

## 2021-05-03 DIAGNOSIS — K8050 Calculus of bile duct without cholangitis or cholecystitis without obstruction: Secondary | ICD-10-CM

## 2021-05-03 DIAGNOSIS — K8020 Calculus of gallbladder without cholecystitis without obstruction: Secondary | ICD-10-CM

## 2021-05-03 DIAGNOSIS — R110 Nausea: Secondary | ICD-10-CM

## 2021-05-03 DIAGNOSIS — K810 Acute cholecystitis: Secondary | ICD-10-CM | POA: Diagnosis present

## 2021-05-03 LAB — CBC WITH DIFFERENTIAL/PLATELET
Abs Immature Granulocytes: 0.06 10*3/uL (ref 0.00–0.07)
Basophils Absolute: 0.1 10*3/uL (ref 0.0–0.1)
Basophils Relative: 0 %
Eosinophils Absolute: 0.1 10*3/uL (ref 0.0–0.5)
Eosinophils Relative: 1 %
HCT: 46.7 % (ref 39.0–52.0)
Hemoglobin: 15.3 g/dL (ref 13.0–17.0)
Immature Granulocytes: 1 %
Lymphocytes Relative: 42 %
Lymphs Abs: 5.1 10*3/uL — ABNORMAL HIGH (ref 0.7–4.0)
MCH: 28.8 pg (ref 26.0–34.0)
MCHC: 32.8 g/dL (ref 30.0–36.0)
MCV: 87.8 fL (ref 80.0–100.0)
Monocytes Absolute: 1 10*3/uL (ref 0.1–1.0)
Monocytes Relative: 8 %
Neutro Abs: 6 10*3/uL (ref 1.7–7.7)
Neutrophils Relative %: 48 %
Platelets: 209 10*3/uL (ref 150–400)
RBC: 5.32 MIL/uL (ref 4.22–5.81)
RDW: 13 % (ref 11.5–15.5)
WBC: 12.3 10*3/uL — ABNORMAL HIGH (ref 4.0–10.5)
nRBC: 0 % (ref 0.0–0.2)

## 2021-05-03 LAB — COMPREHENSIVE METABOLIC PANEL
ALT: 17 U/L (ref 0–44)
AST: 18 U/L (ref 15–41)
Albumin: 3.9 g/dL (ref 3.5–5.0)
Alkaline Phosphatase: 71 U/L (ref 38–126)
Anion gap: 8 (ref 5–15)
BUN: 16 mg/dL (ref 8–23)
CO2: 26 mmol/L (ref 22–32)
Calcium: 9.1 mg/dL (ref 8.9–10.3)
Chloride: 104 mmol/L (ref 98–111)
Creatinine, Ser: 1.25 mg/dL — ABNORMAL HIGH (ref 0.61–1.24)
GFR, Estimated: >60 mL/min (ref >60)
Glucose, Bld: 115 mg/dL — ABNORMAL HIGH (ref 70–99)
Potassium: 3.8 mmol/L (ref 3.5–5.1)
Sodium: 138 mmol/L (ref 135–145)
Total Bilirubin: 0.8 mg/dL (ref 0.3–1.2)
Total Protein: 6.7 g/dL (ref 6.5–8.1)

## 2021-05-03 LAB — TROPONIN I (HIGH SENSITIVITY): Troponin I (High Sensitivity): 5 ng/L (ref <18)

## 2021-05-03 LAB — RESP PANEL BY RT-PCR (FLU A&B, COVID) ARPGX2
Influenza A by PCR: NEGATIVE
Influenza B by PCR: NEGATIVE
SARS Coronavirus 2 by RT PCR: NEGATIVE

## 2021-05-03 LAB — LIPASE, BLOOD: Lipase: 55 U/L — ABNORMAL HIGH (ref 11–51)

## 2021-05-03 SURGERY — CHOLECYSTECTOMY, ROBOT-ASSISTED, LAPAROSCOPIC
Anesthesia: General | Site: Abdomen

## 2021-05-03 MED ORDER — FENTANYL CITRATE PF 50 MCG/ML IJ SOSY
50.0000 ug | PREFILLED_SYRINGE | Freq: Once | INTRAMUSCULAR | Status: AC
  Administered 2021-05-03: 50 ug via INTRAVENOUS
  Filled 2021-05-03: qty 1

## 2021-05-03 MED ORDER — HYDROMORPHONE HCL 1 MG/ML IJ SOLN: 0.5000 mg | Freq: Once | INTRAMUSCULAR | Status: DC

## 2021-05-03 MED ORDER — OXYCODONE HCL 5 MG PO TABS
5.0000 mg | ORAL_TABLET | Freq: Once | ORAL | Status: AC | PRN
  Administered 2021-05-03: 5 mg

## 2021-05-03 MED ORDER — LIDOCAINE HCL (CARDIAC) PF 100 MG/5ML IV SOSY
PREFILLED_SYRINGE | INTRAVENOUS | Status: DC | PRN
  Administered 2021-05-03: 100 mg via INTRAVENOUS

## 2021-05-03 MED ORDER — ONDANSETRON HCL 4 MG/2ML IJ SOLN
4.0000 mg | Freq: Once | INTRAMUSCULAR | Status: AC
  Administered 2021-05-03: 4 mg via INTRAVENOUS
  Filled 2021-05-03: qty 2

## 2021-05-03 MED ORDER — ACETAMINOPHEN 325 MG PO TABS: 650.0000 mg | ORAL_TABLET | Freq: Four times a day (QID) | ORAL | Status: DC | PRN

## 2021-05-03 MED ORDER — KETOROLAC TROMETHAMINE 30 MG/ML IJ SOLN
INTRAMUSCULAR | Status: AC
  Filled 2021-05-03: qty 1

## 2021-05-03 MED ORDER — SODIUM CHLORIDE 0.9 % IV SOLN: INTRAVENOUS | Status: DC

## 2021-05-03 MED ORDER — SODIUM CHLORIDE FLUSH 0.9 % IV SOLN
INTRAVENOUS | Status: AC
  Filled 2021-05-03: qty 10

## 2021-05-03 MED ORDER — BUPIVACAINE-EPINEPHRINE (PF) 0.25% -1:200000 IJ SOLN
INTRAMUSCULAR | Status: AC
  Filled 2021-05-03: qty 30

## 2021-05-03 MED ORDER — ACETAMINOPHEN 10 MG/ML IV SOLN
INTRAVENOUS | Status: AC
  Filled 2021-05-03: qty 100

## 2021-05-03 MED ORDER — ONDANSETRON HCL 4 MG/2ML IJ SOLN: 4.0000 mg | Freq: Once | INTRAMUSCULAR | Status: DC | PRN

## 2021-05-03 MED ORDER — MIDAZOLAM HCL 2 MG/2ML IJ SOLN
INTRAMUSCULAR | Status: AC
  Filled 2021-05-03: qty 2

## 2021-05-03 MED ORDER — INDOCYANINE GREEN 25 MG IV SOLR
1.2500 mg | Freq: Once | INTRAVENOUS | Status: AC
  Administered 2021-05-03: 1.25 mg via INTRAVENOUS
  Filled 2021-05-03: qty 0.5

## 2021-05-03 MED ORDER — FENTANYL CITRATE (PF) 100 MCG/2ML IJ SOLN: 25.0000 ug | INTRAMUSCULAR | Status: DC | PRN

## 2021-05-03 MED ORDER — PROPOFOL 10 MG/ML IV BOLUS
INTRAVENOUS | Status: AC
  Filled 2021-05-03: qty 20

## 2021-05-03 MED ORDER — ONDANSETRON HCL 4 MG/2ML IJ SOLN
INTRAMUSCULAR | Status: DC | PRN
  Administered 2021-05-03: 4 mg via INTRAVENOUS

## 2021-05-03 MED ORDER — PIPERACILLIN-TAZOBACTAM 3.375 G IVPB
3.3750 g | Freq: Three times a day (TID) | INTRAVENOUS | Status: DC
  Administered 2021-05-03: 3.375 g via INTRAVENOUS
  Filled 2021-05-03 (×2): qty 50

## 2021-05-03 MED ORDER — ONDANSETRON HCL 4 MG/2ML IJ SOLN: 4.0000 mg | Freq: Four times a day (QID) | INTRAMUSCULAR | Status: DC | PRN

## 2021-05-03 MED ORDER — ENOXAPARIN SODIUM 40 MG/0.4ML IJ SOSY: 40.0000 mg | PREFILLED_SYRINGE | INTRAMUSCULAR | Status: DC

## 2021-05-03 MED ORDER — CEFAZOLIN SODIUM 1 G IJ SOLR
INTRAMUSCULAR | Status: AC
  Filled 2021-05-03: qty 20

## 2021-05-03 MED ORDER — HYDROCODONE-ACETAMINOPHEN 5-325 MG PO TABS
1.0000 | ORAL_TABLET | ORAL | Status: DC | PRN
  Administered 2021-05-03 (×2): 1 via ORAL
  Filled 2021-05-03 (×3): qty 1

## 2021-05-03 MED ORDER — PHENYLEPHRINE HCL-NACL 20-0.9 MG/250ML-% IV SOLN
INTRAVENOUS | Status: AC
Start: 2021-05-03 — End: ?
  Filled 2021-05-03: qty 250

## 2021-05-03 MED ORDER — 0.9 % SODIUM CHLORIDE (POUR BTL) OPTIME
TOPICAL | Status: DC | PRN
Start: 2021-05-03 — End: 2021-05-03
  Administered 2021-05-03: 500 mL

## 2021-05-03 MED ORDER — FENTANYL CITRATE (PF) 100 MCG/2ML IJ SOLN
INTRAMUSCULAR | Status: AC
  Filled 2021-05-03: qty 2

## 2021-05-03 MED ORDER — CHLORHEXIDINE GLUCONATE 0.12 % MT SOLN
OROMUCOSAL | Status: AC
  Administered 2021-05-03: 15 mL
  Filled 2021-05-03: qty 15

## 2021-05-03 MED ORDER — BUPIVACAINE-EPINEPHRINE (PF) 0.5% -1:200000 IJ SOLN
INTRAMUSCULAR | Status: AC
  Filled 2021-05-03: qty 30

## 2021-05-03 MED ORDER — SEVOFLURANE IN SOLN
RESPIRATORY_TRACT | Status: AC
  Filled 2021-05-03: qty 250

## 2021-05-03 MED ORDER — SODIUM CHLORIDE (PF) 0.9 % IJ SOLN
INTRAMUSCULAR | Status: AC
  Filled 2021-05-03: qty 20

## 2021-05-03 MED ORDER — ACETAMINOPHEN 650 MG RE SUPP: 650.0000 mg | Freq: Four times a day (QID) | RECTAL | Status: DC | PRN

## 2021-05-03 MED ORDER — DEXAMETHASONE SODIUM PHOSPHATE 10 MG/ML IJ SOLN
INTRAMUSCULAR | Status: DC | PRN
  Administered 2021-05-03: 5 mg via INTRAVENOUS

## 2021-05-03 MED ORDER — MIDAZOLAM HCL 2 MG/2ML IJ SOLN
INTRAMUSCULAR | Status: DC | PRN
  Administered 2021-05-03: 2 mg via INTRAVENOUS

## 2021-05-03 MED ORDER — BUPIVACAINE-EPINEPHRINE 0.25% -1:200000 IJ SOLN
INTRAMUSCULAR | Status: DC | PRN
  Administered 2021-05-03: 30 mL

## 2021-05-03 MED ORDER — CEFAZOLIN SODIUM-DEXTROSE 2-3 GM-%(50ML) IV SOLR
INTRAVENOUS | Status: DC | PRN
  Administered 2021-05-03: 2 g via INTRAVENOUS

## 2021-05-03 MED ORDER — OXYCODONE HCL 5 MG/5ML PO SOLN: 5.0000 mg | Freq: Once | ORAL | Status: AC | PRN

## 2021-05-03 MED ORDER — PROPOFOL 10 MG/ML IV BOLUS
INTRAVENOUS | Status: DC | PRN
  Administered 2021-05-03: 180 mg via INTRAVENOUS

## 2021-05-03 MED ORDER — SUGAMMADEX SODIUM 200 MG/2ML IV SOLN
INTRAVENOUS | Status: DC | PRN
Start: 2021-05-03 — End: 2021-05-03
  Administered 2021-05-03: 217.8 mg via INTRAVENOUS

## 2021-05-03 MED ORDER — ROCURONIUM BROMIDE 100 MG/10ML IV SOLN
INTRAVENOUS | Status: DC | PRN
Start: 2021-05-03 — End: 2021-05-03
  Administered 2021-05-03: 20 mg via INTRAVENOUS
  Administered 2021-05-03: 50 mg via INTRAVENOUS

## 2021-05-03 MED ORDER — KETOROLAC TROMETHAMINE 30 MG/ML IJ SOLN
INTRAMUSCULAR | Status: DC | PRN
Start: 2021-05-03 — End: 2021-05-03
  Administered 2021-05-03: 15 mg via INTRAVENOUS

## 2021-05-03 MED ORDER — OXYCODONE HCL 5 MG PO TABS
ORAL_TABLET | ORAL | Status: AC
  Filled 2021-05-03: qty 1

## 2021-05-03 MED ORDER — SUCCINYLCHOLINE CHLORIDE 200 MG/10ML IV SOSY
PREFILLED_SYRINGE | INTRAVENOUS | Status: DC | PRN
  Administered 2021-05-03: 120 mg via INTRAVENOUS

## 2021-05-03 MED ORDER — FENTANYL CITRATE (PF) 100 MCG/2ML IJ SOLN
INTRAMUSCULAR | Status: DC | PRN
  Administered 2021-05-03 (×2): 50 ug via INTRAVENOUS

## 2021-05-03 MED ORDER — SODIUM CHLORIDE 0.9 % IV BOLUS
500.0000 mL | Freq: Once | INTRAVENOUS | Status: AC
  Administered 2021-05-03: 500 mL via INTRAVENOUS

## 2021-05-03 MED ORDER — ONDANSETRON 4 MG PO TBDP: 4.0000 mg | ORAL_TABLET | Freq: Four times a day (QID) | ORAL | Status: DC | PRN

## 2021-05-03 MED ORDER — FAMOTIDINE IN NACL 20-0.9 MG/50ML-% IV SOLN
20.0000 mg | Freq: Once | INTRAVENOUS | Status: AC
  Administered 2021-05-03: 20 mg via INTRAVENOUS
  Filled 2021-05-03: qty 50

## 2021-05-03 MED ORDER — ACETAMINOPHEN 10 MG/ML IV SOLN
INTRAVENOUS | Status: DC | PRN
  Administered 2021-05-03: 1000 mg via INTRAVENOUS

## 2021-05-03 MED ORDER — ACETAMINOPHEN 325 MG PO TABS
ORAL_TABLET | ORAL | Status: AC
  Filled 2021-05-03: qty 2

## 2021-05-03 MED ORDER — PANTOPRAZOLE SODIUM 40 MG IV SOLR
40.0000 mg | Freq: Every day | INTRAVENOUS | Status: DC
  Administered 2021-05-03: 40 mg via INTRAVENOUS
  Filled 2021-05-03: qty 10

## 2021-05-03 MED ORDER — ENOXAPARIN SODIUM 60 MG/0.6ML IJ SOSY
0.5000 mg/kg | PREFILLED_SYRINGE | INTRAMUSCULAR | Status: DC
  Administered 2021-05-03: 55 mg via SUBCUTANEOUS
  Filled 2021-05-03: qty 0.6

## 2021-05-03 MED ORDER — HYDROMORPHONE HCL 1 MG/ML IJ SOLN
0.5000 mg | Freq: Once | INTRAMUSCULAR | Status: AC
  Administered 2021-05-03: 0.5 mg via INTRAVENOUS
  Filled 2021-05-03: qty 1

## 2021-05-03 MED ORDER — MORPHINE SULFATE (PF) 4 MG/ML IV SOLN: 4.0000 mg | INTRAVENOUS | Status: DC | PRN

## 2021-05-03 SURGICAL SUPPLY — 50 items
BAG INFUSER PRESSURE 100CC (MISCELLANEOUS) IMPLANT
BLADE SURG SZ11 CARB STEEL (BLADE) ×2 IMPLANT
CANNULA REDUC XI 12-8 STAPL (CANNULA) ×1
CANNULA REDUCER 12-8 DVNC XI (CANNULA) ×1 IMPLANT
CLIP LIGATING HEMO O LOK GREEN (MISCELLANEOUS) ×2 IMPLANT
DECANTER SPIKE VIAL GLASS SM (MISCELLANEOUS) ×4 IMPLANT
DERMABOND ADVANCED (GAUZE/BANDAGES/DRESSINGS) ×1
DERMABOND ADVANCED .7 DNX12 (GAUZE/BANDAGES/DRESSINGS) ×1 IMPLANT
DRAPE ARM DVNC X/XI (DISPOSABLE) ×4 IMPLANT
DRAPE COLUMN DVNC XI (DISPOSABLE) ×1 IMPLANT
DRAPE DA VINCI XI ARM (DISPOSABLE) ×4
DRAPE DA VINCI XI COLUMN (DISPOSABLE) ×1
ELECT CAUTERY BLADE 6.4 (BLADE) ×1 IMPLANT
ELECT REM PT RETURN 9FT ADLT (ELECTROSURGICAL) ×2
ELECTRODE REM PT RTRN 9FT ADLT (ELECTROSURGICAL) ×1 IMPLANT
GLOVE SURG ENC MOIS LTX SZ6.5 (GLOVE) ×4 IMPLANT
GLOVE SURG UNDER POLY LF SZ6.5 (GLOVE) ×4 IMPLANT
GOWN STRL REUS W/ TWL LRG LVL3 (GOWN DISPOSABLE) ×3 IMPLANT
GOWN STRL REUS W/TWL LRG LVL3 (GOWN DISPOSABLE) ×3
GRASPER SUT TROCAR 14GX15 (MISCELLANEOUS) ×2 IMPLANT
IRRIGATOR SUCT 8 DISP DVNC XI (IRRIGATION / IRRIGATOR) IMPLANT
IRRIGATOR SUCTION 8MM XI DISP (IRRIGATION / IRRIGATOR)
IV NS 1000ML (IV SOLUTION)
IV NS 1000ML BAXH (IV SOLUTION) IMPLANT
KIT PINK PAD W/HEAD ARE REST (MISCELLANEOUS) ×2 IMPLANT
KIT PINK PAD W/HEAD ARM REST (MISCELLANEOUS) ×1 IMPLANT
LABEL OR SOLS (LABEL) ×2 IMPLANT
MANIFOLD NEPTUNE II (INSTRUMENTS) ×2 IMPLANT
NDL INSUFFLATION 14GA 120MM (NEEDLE) ×1 IMPLANT
NEEDLE HYPO 22GX1.5 SAFETY (NEEDLE) ×2 IMPLANT
NEEDLE INSUFFLATION 14GA 120MM (NEEDLE) ×2 IMPLANT
NS IRRIG 500ML POUR BTL (IV SOLUTION) ×2 IMPLANT
OBTURATOR OPTICAL STANDARD 8MM (TROCAR) ×1
OBTURATOR OPTICAL STND 8 DVNC (TROCAR) ×1
OBTURATOR OPTICALSTD 8 DVNC (TROCAR) ×1 IMPLANT
PACK LAP CHOLECYSTECTOMY (MISCELLANEOUS) ×2 IMPLANT
PENCIL ELECTRO HAND CTR (MISCELLANEOUS) ×1 IMPLANT
POUCH SPECIMEN RETRIEVAL 10MM (ENDOMECHANICALS) ×2 IMPLANT
SEAL CANN UNIV 5-8 DVNC XI (MISCELLANEOUS) ×3 IMPLANT
SEAL XI 5MM-8MM UNIVERSAL (MISCELLANEOUS) ×3
SET TUBE SMOKE EVAC HIGH FLOW (TUBING) ×2 IMPLANT
SOLUTION ELECTROLUBE (MISCELLANEOUS) ×2 IMPLANT
SPONGE T-LAP 4X18 ~~LOC~~+RFID (SPONGE) IMPLANT
STAPLER CANNULA SEAL DVNC XI (STAPLE) ×1 IMPLANT
STAPLER CANNULA SEAL XI (STAPLE) ×1
SUT MNCRL 4-0 (SUTURE) ×2
SUT MNCRL 4-0 27XMFL (SUTURE) ×2
SUT VICRYL 0 AB UR-6 (SUTURE) ×2 IMPLANT
SUTURE MNCRL 4-0 27XMF (SUTURE) ×1 IMPLANT
WATER STERILE IRR 500ML POUR (IV SOLUTION) ×2 IMPLANT

## 2021-05-03 NOTE — ED Notes (Signed)
Pt updated on need for urine sample. Pt denies any need to go at this time. Will let me know when they are able to do so.  °

## 2021-05-03 NOTE — ED Notes (Signed)
Ultrasound at bedside

## 2021-05-03 NOTE — Op Note (Signed)
Preoperative diagnosis: Acute cholecystitis  Postoperative diagnosis: Same   Procedure: Robotic Assisted Laparoscopic Cholecystectomy.   Anesthesia: GETA   Surgeon: Dr. Windell Moment  Wound Classification: Clean Contaminated  Indications: Patient is a 61 y.o. male developed right upper quadrant pain, nausea and leukocytosis and on workup was found to have cholelithiasis with a normal common duct and large stone on gallbladder neck without able to control pain consistent with cholecystitis. Robotic Assisted Laparoscopic cholecystectomy was elected.  Findings:  Critical view of safety achieved Cystic duct and artery identified, ligated and divided Adequate hemostasis     Description of procedure: The patient was placed on the operating table in the supine position. General anesthesia was induced. A time-out was completed verifying correct patient, procedure, site, positioning, and implant(s) and/or special equipment prior to beginning this procedure. An orogastric tube was placed. The abdomen was prepped and draped in the usual sterile fashion.  An incision was made in a natural skin line below the umbilicus.  The fascia was elevated and the Veress needle inserted. Proper position was confirmed by aspiration and saline meniscus test.  The abdomen was insufflated with carbon dioxide to a pressure of 15 mmHg. The patient tolerated insufflation well. A 8-mm trocar was then inserted in optiview fashion.  The laparoscope was inserted and the abdomen inspected. No injuries from initial trocar placement were noted. Additional trocars were then inserted in the following locations: an 8-mm trocar in the left lateral abdomen, and another two 8-mm trocars to the right side of the abdomen 5 cm appart. The umbilical trocar was changed to a 12 mm trocar all under direct visualization. The abdomen was inspected and no abnormalities were found. The table was placed in the reverse Trendelenburg position with  the right side up. The robotic arms were docked and target anatomy identified. Instrument inserted under direct visualization.  Filmy adhesions between the gallbladder and omentum, duodenum and transverse colon were lysed with electrocautery. The dome of the gallbladder was grasped with a prograsp and retracted over the dome of the liver. The infundibulum was also grasped with an atraumatic grasper and retracted toward the right lower quadrant. This maneuver exposed Calots triangle. The peritoneum overlying the gallbladder infundibulum was then incised and the cystic duct and cystic artery identified and circumferentially dissected. Critical view of safety reviewed before ligating any structure. Firefly images taken to visualize biliary ducts. The cystic duct and cystic artery were then doubly clipped and divided close to the gallbladder.  The gallbladder was then dissected from its peritoneal attachments by electrocautery. Hemostasis was checked and the gallbladder and contained stones were removed using an endoscopic retrieval bag. The gallbladder was passed off the table as a specimen. There was no evidence of bleeding from the gallbladder fossa or cystic artery or leakage of the bile from the cystic duct stump. Secondary trocars were removed under direct vision. No bleeding was noted. The robotic arms were undoked. The scope was withdrawn and the umbilical trocar removed. The abdomen was allowed to collapse. The fascia of the 10mm trocar sites was closed with figure-of-eight 0 vicryl sutures. The skin was closed with subcuticular sutures of 4-0 monocryl and topical skin adhesive. The orogastric tube was removed.  The patient tolerated the procedure well and was taken to the postanesthesia care unit in stable condition.   Specimen: Gallbladder  Complications: None  EBL: 5 mL

## 2021-05-03 NOTE — H&P (Signed)
SURGICAL CONSULTATION NOTE   HISTORY OF PRESENT ILLNESS (HPI):  61 y.o. male presented to [facility] ED for evaluation of abdominal pain. Patient reports started having abdominal pain last night.  The pain was under the right rib cage.  The pain did not radiate to other part of body.  Patient cannot identify any alleviating or aggravating factor.  Patient denies fever chills.  Patient endorses associated nausea and vomiting.  He came to the ED and he was found with mild leukocytosis.  He had ultrasound of the abdomen showing a large gallstone stuck in the gallbladder neck.  No significant gallbladder thickening or pericholecystic fluid.  Common bile duct within normal limits.  Bilirubin within normal limits.  I personally evaluated the images.  He has been treated with pain medication without improvement of pain.  Also no significant improvement of nausea.  Surgery is consulted by Dr. Beather Arbour in this context for evaluation and management of acute cholecystitis.  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 Authorizing Provider  Coenzyme Q10 (COQ10 PO) Take 1 tablet by mouth daily. Patient not taking: Reported on 09/28/2019    [provider]  MULTIPLE VITAMIN PO Take by mouth. Patient not taking: Reported on 05/03/2021    [provider]  Omega-3 Fatty Acids (FISH OIL) 1000 MG CAPS Take by mouth. Patient not taking: Reported on 09/28/2019    [provider]     ALLERGIES:  No Known Allergies   SOCIAL HISTORY:  Social History   Socioeconomic History   Marital status: Married    Spouse name: Not on file   Number of children: 3    Years of education: Anselm Lis   Highest education level: Not on file  Occupational History   Occupation: Investment banker, operational  Tobacco Use   Smoking status: Former    Types: Cigars   Smokeless tobacco: Never   Tobacco comments:    quit cigars around 2016  Substance and Sexual Activity   Alcohol use: Yes    Alcohol/week: 0.0 standard drinks    Comment: 1 beer every 2 weeks   Drug use: No   Sexual activity: Not on file  Other Topics Concern   Not on file  Social History Narrative   Not on file   Social Determinants of Health   Financial Resource Strain: Not on file  Food Insecurity: Not on file  Transportation Needs: Not on file  Physical Activity: Not on file  Stress: Not on file  Social Connections: Not on file  Intimate Partner Violence: Not on file     FAMILY HISTORY:  Family History  Problem Relation Age of Onset   Ovarian cancer Mother    Breast cancer Mother    Diabetes Father    Heart disease Father    Bipolar disorder Father      REVIEW OF SYSTEMS:  Constitutional: denies weight loss, fever, chills, or sweats  Eyes: denies any other vision changes, history of eye injury  ENT: denies sore throat, hearing problems  Respiratory: denies shortness of breath, wheezing  Cardiovascular: denies chest pain, palpitations  Gastrointestinal: positive abdominal pain, nausea and vomitnig Genitourinary: denies burning with urination or urinary frequency Musculoskeletal: denies any other joint pains or cramps  Skin: denies any other rashes or skin discolorations  Neurological: denies any other headache, dizziness, weakness  Psychiatric: denies any other depression, anxiety   All other review of systems were negative   VITAL SIGNS:  Temp:  [97.7 F (36.5 C)] 97.7 F (36.5 C) (02/16 0001) Pulse Rate:  [63-74] 63 (02/16 0600) Resp:  [11-19] 12 (02/16 0600) BP: (119-155)/(80-98) 119/80 (02/16 0230) SpO2:  [94 %-97 %] 95 % (02/16 0600) Weight:  [108.9 kg] 108.9 kg  (02/16 0009)     Height: 6' (182.9 cm) Weight: 108.9 kg BMI (Calculated): 32.54   INTAKE/OUTPUT:  This shift: Total I/O In: 546.7 [IV Piggyback:546.7] Out: -   Last 2 shifts: @IOLAST2SHIFTS @   PHYSICAL EXAM:  Constitutional:  -- Normal body habitus  -- Awake, alert, and oriented x3  Eyes:  -- Pupils equally round and reactive to light  -- No scleral icterus  Ear, nose, and throat:  -- No jugular venous distension  Pulmonary:  -- No crackles  -- Equal breath sounds bilaterally -- Breathing non-labored at rest Cardiovascular:  -- S1, S2 present  -- No pericardial rubs Gastrointestinal:  -- Abdomen soft, mild tender to palpation in the right upper quadrant, non-distended, no guarding or rebound tenderness -- No abdominal masses appreciated, pulsatile or otherwise  Musculoskeletal and Integumentary:  -- Wounds: None appreciated -- Extremities: B/L UE and LE FROM, hands and feet warm, no edema  Neurologic:  -- Motor function: intact and symmetric -- Sensation: intact and symmetric   Labs:  CBC Latest Ref Rng & Units 05/03/2021 08/23/2019 02/18/2014  WBC 4.0 - 10.5 K/uL 12.3(H) 9.2 9.4  Hemoglobin 13.0 - 17.0 g/dL 15.3 15.8 15.5  Hematocrit 39.0 - 52.0 % 46.7 46.2 42  Platelets 150 - 400 K/uL 209 244 250   CMP Latest Ref Rng & Units 05/03/2021 10/07/2019 08/23/2019  Glucose 70 - 99 mg/dL 115(H) - 79  BUN 8 - 23 mg/dL 16 - 15  Creatinine 0.61 - 1.24 mg/dL 1.25(H) 1.40(H) 1.30(H)  Sodium 135 - 145 mmol/L 138 - 143  Potassium 3.5 - 5.1 mmol/L 3.8 - 4.4  Chloride 98 - 111 mmol/L 104 - 103  CO2 22 - 32 mmol/L 26 - 22  Calcium 8.9 - 10.3 mg/dL 9.1 - 9.9  Total Protein 6.5 - 8.1 g/dL 6.7 - 6.4  Total Bilirubin 0.3 - 1.2 mg/dL 0.8 - 0.6  Alkaline Phos 38 - 126 U/L 71 - 86  AST 15 - 41 U/L 18 - 18  ALT 0 - 44 U/L 17 - 20    Imaging studies:  EXAM: ULTRASOUND ABDOMEN LIMITED RIGHT UPPER QUADRANT   COMPARISON:  CT abdomen pelvis 10/07/2019   FINDINGS: Gallbladder:    Calcified gallstone within the neck of the gallbladder lumen measuring up to 2.1 cm. No gallbladder wall thickening or pericholecystic fluid visualized. Unable to evaluate for sonographic Murphy sign as patient premedicated.   Common bile duct:   Diameter: 3 mm.   Liver:   No focal lesion identified. Increased parenchymal echogenicity. Portal vein is patent on color Doppler imaging with normal direction of blood flow towards the liver.   Other: None.   IMPRESSION: 1. Cholelithiasis with a 2.1 cm gallstone within the neck of the gallbladder. No definite findings of acute cholecystitis. Unable to evaluate for sonographic Murphy sign as patient premedicated. 2. Hepatic steatosis.  Electronically Signed   By: Iven Finn M.D.   On: 05/03/2021 01:28  Assessment/Plan:  61 y.o. male with acute cholecystitis.  Patient with history, physical exam and images consistent with acute cholecystitis. Patient oriented about diagnosis and surgical management as treatment.   Discussed the risk of surgery including post-op infxn, seroma, biloma, chronic pain, poor-delayed wound healing, retained gallstone, conversion to open procedure, post-op SBO or ileus, and need for additional procedures to address said risks.  The risks of general anesthetic including MI, CVA, sudden death or even reaction to anesthetic medications also discussed. Alternatives include continued observation.  Benefits include possible symptom relief, prevention of complications including acute cholecystitis, pancreatitis.  Arnold Long, MD

## 2021-05-03 NOTE — Anesthesia Postprocedure Evaluation (Signed)
Anesthesia Post Note  Patient: Austin Larson  Procedure(s) Performed: XI ROBOTIC ASSISTED LAPAROSCOPIC CHOLECYSTECTOMY (Abdomen) INDOCYANINE GREEN FLUORESCENCE IMAGING (ICG) (Abdomen)  Patient location during evaluation: PACU Anesthesia Type: General Level of consciousness: awake and alert Pain management: pain level controlled Vital Signs Assessment: post-procedure vital signs reviewed and stable Respiratory status: spontaneous breathing, nonlabored ventilation, respiratory function stable and patient connected to nasal cannula oxygen Cardiovascular status: blood pressure returned to baseline and stable Postop Assessment: no apparent nausea or vomiting Anesthetic complications: no   No notable events documented.   Last Vitals:  Vitals:   05/03/21 1415 05/03/21 1512  BP: 137/90 121/74  Pulse: 68 70  Resp: 14 18  Temp: 36.6 C 37 C  SpO2: 97% 94%    Last Pain:  Vitals:   05/03/21 1512  TempSrc: Oral  PainSc:                  Arita Miss

## 2021-05-03 NOTE — Anesthesia Preprocedure Evaluation (Signed)
Anesthesia Evaluation  Patient identified by MRN, date of birth, ID band Patient awake    Reviewed: Allergy & Precautions, NPO status , Patient's Chart, lab work & pertinent test results  History of Anesthesia Complications Negative for: history of anesthetic complications  Airway Mallampati: II  TM Distance: >3 FB Neck ROM: Full    Dental no notable dental hx. (+) Teeth Intact   Pulmonary neg pulmonary ROS, neg sleep apnea, neg COPD, Patient abstained from smoking.Not current smoker, former smoker,    Pulmonary exam normal breath sounds clear to auscultation       Cardiovascular Exercise Tolerance: Good METS(-) hypertension(-) CAD and (-) Past MI negative cardio ROS  (-) dysrhythmias  Rhythm:Regular Rate:Normal - Systolic murmurs    Neuro/Psych negative neurological ROS  negative psych ROS   GI/Hepatic neg GERD  ,(+)     (-) substance abuse  ,   Endo/Other  neg diabetes  Renal/GU negative Renal ROS     Musculoskeletal   Abdominal   Peds  Hematology   Anesthesia Other Findings Past Medical History: No date: Chicken pox No date: Gallstones  Reproductive/Obstetrics                             Anesthesia Physical Anesthesia Plan  ASA: 2  Anesthesia Plan: General   Post-op Pain Management: Ofirmev IV (intra-op)* and Toradol IV (intra-op)*   Induction: Intravenous  PONV Risk Score and Plan: 4 or greater and Ondansetron, Dexamethasone and Midazolam  Airway Management Planned: Oral ETT  Additional Equipment: None  Intra-op Plan:   Post-operative Plan: Extubation in OR  Informed Consent: I have reviewed the patients History and Physical, chart, labs and discussed the procedure including the risks, benefits and alternatives for the proposed anesthesia with the patient or authorized representative who has indicated his/her understanding and acceptance.     Dental advisory  given  Plan Discussed with: CRNA and Surgeon  Anesthesia Plan Comments: (Discussed risks of anesthesia with patient, including PONV, sore throat, lip/dental/eye damage. Rare risks discussed as well, such as cardiorespiratory and neurological sequelae, and allergic reactions. Discussed the role of CRNA in patient's perioperative care. Patient understands.  Patient denies N/V. Will plan for modified RSI.)        Anesthesia Quick Evaluation

## 2021-05-03 NOTE — ED Provider Notes (Signed)
[facility] Provider Note    Event Date/Time   First MD Initiated Contact with Patient 05/03/21 0011     (approximate)   History   Abdominal Pain   HPI  Austin Larson is a 61 y.o. male who presents to the ED from home with a chief complaint of abdominal pain.  Reports epigastric pain approximately 9 PM, 2 hours after eating.  Describes pain as sharp, constant, nonradiating associated with nausea.  Denies associated fever, chills, cough, chest pain, shortness of breath, vomiting or diarrhea.  Reports history of gallstones 10 years ago but learned to manage his symptoms by modifying his diet.     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 Authorizing Provider  Coenzyme Q10 (COQ10 PO) Take 1 tablet by mouth daily. Patient not taking: Reported on 09/28/2019    [provider]  MULTIPLE VITAMIN PO Take by mouth.    [provider]  Omega-3 Fatty Acids (FISH OIL) 1000 MG CAPS Take by mouth. Patient not taking: Reported on 09/28/2019    [provider]     Allergies  Patient has no known allergies.   Family History   Family History  Problem Relation Age  of Onset   Ovarian cancer Mother    Breast cancer Mother    Diabetes Father    Heart disease Father    Bipolar disorder Father      Physical Exam  Triage Vital Signs: ED Triage Vitals  Enc Vitals Group     BP 05/03/21 0001 (!) 155/98     Pulse Rate 05/03/21 0001 73     Resp 05/03/21 0001 18     Temp 05/03/21 0001 97.7 F (36.5 C)     Temp Source 05/03/21 0001 Oral     SpO2 05/03/21 0001 97 %     Weight 05/03/21 0009 240 lb (108.9 kg)     Height 05/03/21 0009 6' (1.829 m)     Head Circumference --      Peak Flow --      Pain Score 05/03/21 0009 6     Pain Loc --      Pain Edu? --      Excl. in [place]? --     Updated Vital Signs: BP 129/81    Pulse 63    Temp 97.7 F (36.5 C) (Oral)    Resp 16    Ht 6' (1.829 m)    Wt 108.9 kg    SpO2 95%    BMI 32.55 kg/m    General: Awake, mild distress.  CV:  RRR.  Good peripheral perfusion.  Resp:  Normal effort.  Abd:  Mildly tender to palpation epigastrium and right upper quadrant without rebound or guarding no distention.  No abdominal bruits. Other:  Appears pale   ED Results / Procedures / Treatments  Labs (all labs ordered are listed, but only abnormal results are displayed) Labs Reviewed  LIPASE, BLOOD - Abnormal; Notable for the following components:      Result Value   Lipase 55 (*)    All other components within normal limits  COMPREHENSIVE METABOLIC PANEL - Abnormal; Notable for the following components:   Glucose, Bld 115 (*)    Creatinine, Ser 1.25 (*)    All other components within normal limits  CBC WITH DIFFERENTIAL/PLATELET - Abnormal; Notable for the following components:   WBC 12.3 (*)    Lymphs Abs 5.1 (*)    All other components within normal limits  RESP PANEL BY RT-PCR (FLU A&B, COVID) ARPGX2  TROPONIN I (HIGH SENSITIVITY)     EKG  ED ECG REPORT I, Joshia Kitchings J, the attending physician, personally viewed and interpreted this ECG.   Date: 05/03/2021  EKG Time: 0013  Rate: 65  Rhythm: normal  sinus rhythm  Axis: Normal  Intervals:none  ST&T Change: Nonspecific    RADIOLOGY I independently interpreted and visualized the ultrasound as well as the radiology interpretation:  Ultrasound: Cholelithiasis with 2.1 cm gallstone within gallbladder neck without definite findings of acute cholecystitis  Official radiology report(s): US ABDOMEN LIMITED RUQ (LIVER/GB)  Result Date: 05/03/2021 CLINICAL DATA:  epigastric pain, h/o gallstones EXAM: ULTRASOUND ABDOMEN LIMITED RIGHT UPPER QUADRANT COMPARISON:  CT abdomen pelvis 10/07/2019 FINDINGS: Gallbladder: Calcified gallstone within the neck of the gallbladder lumen measuring up to 2.1 cm. No gallbladder wall thickening or pericholecystic fluid visualized. Unable to evaluate for sonographic Murphy sign as patient premedicated. Common bile duct: Diameter: 3 mm. Liver: No focal lesion identified. Increased parenchymal echogenicity. Portal vein is patent on color Doppler imaging with normal direction of blood flow towards the liver. Other: None. IMPRESSION: 1. Cholelithiasis with a 2.1 cm gallstone within the neck of the gallbladder. No definite findings of acute cholecystitis. Unable to evaluate for sonographic Murphy sign as patient premedicated. 2. Hepatic steatosis. Electronically Signed   By: Iven Finn M.D.   On: 05/03/2021 01:28     PROCEDURES:  Critical Care performed: Yes  CRITICAL CARE Performed by: Paulette Blanch   Total critical care time: 45 minutes  Critical care time was exclusive of separately billable procedures and treating other patients.  Critical care was necessary to treat or prevent imminent or life-threatening deterioration.  Critical care was time spent personally by me on the following activities: development of treatment plan with patient and/or surrogate as well as nursing, discussions with consultants, evaluation of patient's response to treatment, examination of patient, obtaining history from patient or  surrogate, ordering and performing treatments and interventions, ordering and review of laboratory studies, ordering and review of radiographic studies, pulse oximetry and re-evaluation of patient's condition.   Marland Kitchen1-3 Lead EKG Interpretation Performed by: Paulette Blanch, MD Authorized by: Paulette Blanch, MD     Interpretation: normal     ECG rate:  73   ECG rate assessment: normal     Rhythm: sinus rhythm     Ectopy: none     Conduction: normal   Comments:     Patient placed on cardiac monitor to evaluate for arrhythmias   MEDICATIONS ORDERED IN ED: Medications  HYDROmorphone (DILAUDID) injection 0.5  mg (has no administration in time range)  sodium chloride 0.9 % bolus 500 mL (0 mLs Intravenous Stopped 05/03/21 0125)  ondansetron (ZOFRAN) injection 4 mg (4 mg Intravenous Given 05/03/21 0043)  fentaNYL (SUBLIMAZE) injection 50 mcg (50 mcg Intravenous Given 05/03/21 0046)  famotidine (PEPCID) IVPB 20 mg premix (0 mg Intravenous Stopped 05/03/21 0149)  HYDROmorphone (DILAUDID) injection 0.5 mg (0.5 mg Intravenous Given 05/03/21 0147)  ondansetron (ZOFRAN) injection 4 mg (4 mg Intravenous Given 05/03/21 0147)     IMPRESSION / MDM / ASSESSMENT AND PLAN / ED COURSE  I reviewed the triage vital signs and the nursing notes.                             61 year old male presenting with epigastric pain. Differential diagnosis includes, but is not limited to, biliary disease (biliary colic, acute cholecystitis, cholangitis, choledocholithiasis, etc), intrathoracic causes for epigastric abdominal pain including ACS, gastritis, duodenitis, pancreatitis, small bowel or large bowel obstruction, abdominal aortic aneurysm, hernia, and ulcer(s).  I have personally reviewed patient's records and note he had a PCP annual exam on 09/28/2019.  He had an ultrasound on 09/13/2019 demonstrating cholelithiasis without cholecystitis.  He had a CT abdomen pelvis dated 10/07/2019 which demonstrates cholelithiasis without  cholecystitis.  The patient is on the cardiac monitor to evaluate for evidence of arrhythmia and/or significant heart rate changes.  We will obtain abdominal lab work, check troponin.  Obtain right upper quadrant quadrant abdominal ultrasound.  Administer IV Fentanyl for pain paired with IV Zofran for nausea; add IV Pepcid to cover possible gastritis.  Will reassess.  Clinical Course as of 05/03/21 0222  Thu May 03, 2021  0141 Pain better although patient states he can still feel it.  Updated patient and spouse on laboratory and imaging results.  Notable for mildly elevated WBC 12.3, normal LFTs, mildly elevated creatinine, mildly elevated lipase and ultrasound demonstrating 2.1 cm stone in the neck of the gallbladder without associated signs of cholecystitis.  At this time will trial additional analgesia for better pain control. [JS]  6160 Still complaining of moderate to severe pain.  Will discuss case with Dr. Peyton Najjar from general surgery. [JS]  0221 Discussed case with Dr. Peyton Najjar who will admit patient to the hospital for surgery later this morning. [JS]    Clinical Course User Index [JS] Paulette Blanch, MD     FINAL CLINICAL IMPRESSION(S) / ED DIAGNOSES   Final diagnoses:  Epigastric pain  Nausea  Biliary colic  Calculus of gallbladder without cholecystitis without obstruction  Intractable abdominal pain     Rx / DC Orders   ED Discharge Orders     None        Note:  This document was prepared using Dragon voice recognition software and may include unintentional dictation errors.   Paulette Blanch, MD 05/03/21 [id number]

## 2021-05-03 NOTE — Transfer of Care (Signed)
Immediate Anesthesia Transfer of Care Note  Patient: Austin Larson  Procedure(s) Performed: XI ROBOTIC ASSISTED LAPAROSCOPIC CHOLECYSTECTOMY (Abdomen) INDOCYANINE GREEN FLUORESCENCE IMAGING (ICG) (Abdomen)  Patient Location: PACU  Anesthesia Type:General  Level of Consciousness: awake and alert   Airway & Oxygen Therapy: Patient Spontanous Breathing  Post-op Assessment: Report given to RN and Post -op Vital signs reviewed and stable  Post vital signs: Reviewed and stable  Last Vitals:  Vitals Value Taken Time  BP 129/91 05/03/21 1327  Temp 37.1 C 05/03/21 1327  Pulse 74 05/03/21 1330  Resp 13 05/03/21 1330  SpO2 95 % 05/03/21 1330  Vitals shown include unvalidated device data.  Last Pain:  Vitals:   05/03/21 1036  TempSrc: Oral  PainSc: 0-No pain         Complications: No notable events documented.

## 2021-05-03 NOTE — Anesthesia Procedure Notes (Addendum)
Procedure Name: Intubation Date/Time: 05/03/2021 11:56 AM Performed by: Demetrius Charity, CRNA Pre-anesthesia Checklist: Patient identified, Patient being monitored, Timeout performed, Emergency Drugs available and Suction available Patient Re-evaluated:Patient Re-evaluated prior to induction Oxygen Delivery Method: Circle system utilized Preoxygenation: Pre-oxygenation with 100% oxygen Induction Type: IV induction and Rapid sequence Ventilation: Mask ventilation without difficulty and Oral airway inserted - appropriate to patient size Laryngoscope Size: McGraph and 4 Grade View: Grade III Tube type: Oral Tube size: 7.0 mm Number of attempts: 1 Airway Equipment and Method: Stylet and Video-laryngoscopy Placement Confirmation: ETT inserted through vocal cords under direct vision, positive ETCO2 and breath sounds checked- equal and bilateral Secured at: 24 cm Tube secured with: Tape Dental Injury: Teeth and Oropharynx as per pre-operative assessment  Difficulty Due To: Difficulty was anticipated and Difficult Airway- due to anterior larynx Future Recommendations: Recommend- induction with short-acting agent, and alternative techniques readily available Comments: View with mcgraph suboptimal, recommend use of glidescope and rigid stylet with intubation in future.

## 2021-05-03 NOTE — ED Triage Notes (Signed)
Pt to ED from home c/o upper mid pain and RUQ pain suddenly around 2100 tonight.  Sharp and constant, states some nausea but no vomiting or diarrhea, denies urinary changes.  Pt A&Ox4, chest rise even and unlabored, skin WNL and in NAD at this time.

## 2021-05-04 LAB — SURGICAL PATHOLOGY

## 2021-05-04 MED ORDER — HYDROCODONE-ACETAMINOPHEN 5-325 MG PO TABS: 1.0000 | ORAL_TABLET | ORAL | 0 refills | Status: DC | PRN

## 2021-05-04 NOTE — TOC CM/SW Note (Signed)
Patient has orders to discharge home today. Chart reviewed. PCP is Lelon Huh, MD. On room air. Has incision on abdomen (dermabond). No TOC needs identified. CSW signing off.  Dayton Scrape, [place]

## 2021-05-04 NOTE — Discharge Instructions (Signed)

## 2021-05-04 NOTE — Plan of Care (Signed)

## 2021-05-04 NOTE — Discharge Summary (Signed)
° °  Patient ID: Austin Larson MRN: [id number] DOB/AGE: 12-05-1960 61 y.o.  Admit date: 05/03/2021 Discharge date: 05/04/2021   Discharge Diagnoses:  Principal Problem:   Acute cholecystitis   Procedures: Robotic assisted laparoscopic cholecystectomy  Hospital Course: Patient admitted with acute cholecystitis.  He underwent robotic assisted laparoscopic cholecystectomy.  He tolerated the procedure well.  This morning patient is ambulating, pain controlled, tolerating diet.  Incisions are dry and clean.  Physical Exam Constitutional:      Appearance: He is well-developed.  HENT:     Head: Normocephalic.  Cardiovascular:     Rate and Rhythm: Normal rate and regular rhythm.  Pulmonary:     Effort: Pulmonary effort is normal.  Abdominal:     General: Abdomen is flat. Bowel sounds are normal.     Palpations: Abdomen is soft.  Skin:    General: Skin is warm.     Capillary Refill: Capillary refill takes less than 2 seconds.  Neurological:     Mental Status: He is alert and oriented to person, place, and time.     Consults: None  Disposition: Discharge disposition: 01-Home or Self Care       Discharge Instructions     Diet - low sodium heart healthy   Complete by: As directed    Diet - low sodium heart healthy   Complete by: As directed    Increase activity slowly   Complete by: As directed    Increase activity slowly   Complete by: As directed       Allergies as of 05/04/2021   No Known Allergies      Medication List     TAKE these medications    COQ10 PO Take 1 tablet by mouth daily.   Fish Oil 1000 MG Caps Take by mouth.   HYDROcodone-acetaminophen 5-325 MG tablet Commonly known as: NORCO/VICODIN Take 1 tablet by mouth every 4 (four) hours as needed for moderate pain.   MULTIPLE VITAMIN PO Take by mouth.        Follow-up Information     Herbert Pun, MD Follow up in 2 week(s).   Specialty: General Surgery Why: Follow up after  cholceystectomy Contact information: [place] [address] [phone]

## 2021-05-06 ENCOUNTER — Encounter: Payer: Self-pay | Admitting: Family Medicine

## 2021-06-07 ENCOUNTER — Encounter: Payer: Self-pay | Admitting: Family Medicine

## 2021-06-11 ENCOUNTER — Encounter: Payer: Self-pay | Admitting: Family Medicine

## 2021-06-13 ENCOUNTER — Ambulatory Visit: Payer: Managed Care, Other (non HMO) | Admitting: Family Medicine

## 2021-06-21 NOTE — Progress Notes (Signed)
?  ? ?I,Roshena L Chambers,acting as a scribe for Lelon Huh, MD.,have documented all relevant documentation on the behalf of Lelon Huh, MD,as directed by  Lelon Huh, MD while in the presence of Lelon Huh, MD.  ? ? ?Established patient visit ? ? ?Patient: Austin Larson   DOB: 1960-10-11   61 y.o. Male  MRN: [id number] ?Visit Date: 06/22/2021 ? ?Today's healthcare provider: Lelon Huh, MD  ? ?Chief Complaint  ?Patient presents with  ? Back Pain  ? ?Subjective  ?  ?Back Pain ?This is a chronic problem. Episode onset: 10-15 years ago. The problem occurs intermittently. The problem has been gradually worsening since onset. The pain is present in the lumbar spine (also lower abdomen and groin area). The symptoms are aggravated by bending, sitting and twisting. Associated symptoms include abdominal pain. Pertinent negatives include no chest pain or fever. Treatments tried: stretching. The treatment provided moderate relief.   ?Has occasional flare ups every few years that last several days or a few weeks. The most recent episodes seems to be lasting longer than previous. Ibuprofen helps. Does not radiate.  ?Had cholecystectomy in February, but has had flare up of back pain since then. Is always on the right side radiating around to right side of abdomen. ? ? ? ?Medications: ?Outpatient Medications Prior to Visit  ?Medication Sig  ? MULTIPLE VITAMIN PO Take by mouth.  ? Omega-3 Fatty Acids (FISH OIL) 1000 MG CAPS Take by mouth.  ? Coenzyme Q10 (COQ10 PO) Take 1 tablet by mouth daily. (Patient not taking: Reported on 06/22/2021)  ? HYDROcodone-acetaminophen (NORCO/VICODIN) 5-325 MG tablet Take 1 tablet by mouth every 4 (four) hours as needed for moderate pain. (Patient not taking: Reported on 06/22/2021)  ? ?No facility-administered medications prior to visit.  ? ? ?Review of Systems  ?Constitutional:  Negative for appetite change, chills and fever.  ?Respiratory:  Negative for chest tightness, shortness of  breath and wheezing.   ?Cardiovascular:  Negative for chest pain and palpitations.  ?Gastrointestinal:  Positive for abdominal pain. Negative for nausea and vomiting.  ?Musculoskeletal:  Positive for back pain.  ? ? ?  Objective  ?  ?BP 138/90 (BP Location: Right Arm, Patient Position: Sitting)   Pulse 67   Temp 98.5 ?F (36.9 ?C) (Oral)   Resp 14   Wt 222 lb (100.7 kg)   SpO2 98% Comment: room air  BMI 30.11 kg/m?  ? ? ?Today's Vitals  ? 06/22/21 0810 06/22/21 3846  ?BP: (!) 143/91 138/90  ?Pulse: 67   ?Resp: 14   ?Temp: 98.5 ?F (36.9 ?C)   ?TempSrc: Oral   ?SpO2: 98%   ?Weight: 222 lb (100.7 kg)   ? ?Body mass index is 30.11 kg/m?.  ? ?Physical Exam  ?Slight tenderness of right lower thoracic and upper lumbar para lumbar musculature. No spine tenderness.  ? ? Assessment & Plan  ?  ? ?1. Chronic right-sided back pain, unspecified back location ? ?- DG Lumbar Spine Complete; Future ?- DG Thoracic Spine W/Swimmers; Future ? ?2. Colon cancer screening ? ?- Ambulatory referral to gastroenterology for colonoscopy ? ?3. Personal history of colonic polyps ? ?- Ambulatory referral to gastroenterology for colonoscopy  ?   ? ?The entirety of the information documented in the History of Present Illness, Review of Systems and Physical Exam were personally obtained by me. Portions of this information were initially documented by the CMA and reviewed by me for thoroughness and accuracy.   ? ? ?Lelon Huh,  MD  ?[facility] ?[phone] (phone) ?[phone] (fax) ? ?[facility]  ?

## 2021-06-22 ENCOUNTER — Ambulatory Visit
Admission: RE | Admit: 2021-06-22 | Discharge: 2021-06-22 | Disposition: A | Discharge status: Home / Self Care | Payer: Managed Care, Other (non HMO) | Source: Ambulatory Visit | Attending: Family Medicine | Admitting: Family Medicine

## 2021-06-22 ENCOUNTER — Ambulatory Visit (INDEPENDENT_AMBULATORY_CARE_PROVIDER_SITE_OTHER): Payer: Managed Care, Other (non HMO) | Admitting: Family Medicine

## 2021-06-22 ENCOUNTER — Ambulatory Visit
Admission: RE | Admit: 2021-06-22 | Discharge: 2021-06-22 | Disposition: A | Discharge status: Home / Self Care | Payer: Managed Care, Other (non HMO) | Attending: Family Medicine | Admitting: Family Medicine

## 2021-06-22 ENCOUNTER — Encounter: Payer: Self-pay | Admitting: Family Medicine

## 2021-06-22 VITALS — BP 138/90 | HR 67 | Temp 98.5°F | Resp 14 | Wt 222.0 lb

## 2021-06-22 DIAGNOSIS — M549 Dorsalgia, unspecified: Secondary | ICD-10-CM

## 2021-06-22 DIAGNOSIS — Z86010 Personal history of colonic polyps: Secondary | ICD-10-CM | POA: Diagnosis not present

## 2021-06-22 DIAGNOSIS — G8929 Other chronic pain: Secondary | ICD-10-CM

## 2021-06-22 DIAGNOSIS — Z1211 Encounter for screening for malignant neoplasm of colon: Secondary | ICD-10-CM | POA: Diagnosis not present

## 2021-06-22 LAB — POCT URINALYSIS DIPSTICK
Bilirubin, UA: NEGATIVE
Blood, UA: NEGATIVE
Glucose, UA: NEGATIVE
Ketones, UA: NEGATIVE
Leukocytes, UA: NEGATIVE
Nitrite, UA: NEGATIVE
Protein, UA: NEGATIVE
Spec Grav, UA: 1.01 (ref 1.010–1.025)
Urobilinogen, UA: 0.2 E.U./dL
pH, UA: 7.5 (ref 5.0–8.0)

## 2021-06-25 ENCOUNTER — Encounter: Payer: Self-pay | Admitting: Family Medicine

## 2021-06-27 ENCOUNTER — Telehealth: Payer: Self-pay

## 2021-06-27 ENCOUNTER — Other Ambulatory Visit: Payer: Self-pay | Admitting: Family Medicine

## 2021-06-27 DIAGNOSIS — R109 Unspecified abdominal pain: Secondary | ICD-10-CM

## 2021-06-27 NOTE — Telephone Encounter (Signed)
CALLED PATIENT NO ANSWER LEFT VOICEMAIL FOR A CALL BACK °Letter sent °

## 2021-06-27 NOTE — Telephone Encounter (Signed)
CALLED PATIENT NO ANSWER LEFT VOICEMAIL FOR A CALL BACK ?Letters sent ?

## 2021-06-29 NOTE — Telephone Encounter (Signed)
error 

## 2021-07-03 NOTE — Telephone Encounter (Signed)
Patient returning call, requesting call back.  °

## 2021-07-04 ENCOUNTER — Telehealth: Payer: Self-pay

## 2021-07-04 NOTE — Telephone Encounter (Signed)
CALLED PATIENT NO ANSWER LEFT VOICEMAIL FOR A CALL BACK ? ?

## 2021-07-06 ENCOUNTER — Ambulatory Visit: Payer: Managed Care, Other (non HMO) | Admitting: Family Medicine

## 2021-09-05 ENCOUNTER — Ambulatory Visit: Payer: Managed Care, Other (non HMO) | Admitting: Gastroenterology

## 2023-06-20 ENCOUNTER — Ambulatory Visit (INDEPENDENT_AMBULATORY_CARE_PROVIDER_SITE_OTHER): Admitting: Family Medicine

## 2023-06-20 ENCOUNTER — Encounter: Payer: Self-pay | Admitting: Family Medicine

## 2023-06-20 VITALS — BP 136/86 | HR 62 | Temp 98.2°F | Ht 72.0 in | Wt 228.7 lb

## 2023-06-20 DIAGNOSIS — Z125 Encounter for screening for malignant neoplasm of prostate: Secondary | ICD-10-CM

## 2023-06-20 DIAGNOSIS — Z1211 Encounter for screening for malignant neoplasm of colon: Secondary | ICD-10-CM

## 2023-06-20 DIAGNOSIS — H9193 Unspecified hearing loss, bilateral: Secondary | ICD-10-CM

## 2023-06-20 DIAGNOSIS — E785 Hyperlipidemia, unspecified: Secondary | ICD-10-CM

## 2023-06-20 DIAGNOSIS — Z136 Encounter for screening for cardiovascular disorders: Secondary | ICD-10-CM | POA: Diagnosis not present

## 2023-06-20 DIAGNOSIS — Z0000 Encounter for general adult medical examination without abnormal findings: Secondary | ICD-10-CM | POA: Diagnosis not present

## 2023-06-20 DIAGNOSIS — K760 Fatty (change of) liver, not elsewhere classified: Secondary | ICD-10-CM

## 2023-06-20 DIAGNOSIS — L570 Actinic keratosis: Secondary | ICD-10-CM

## 2023-06-20 NOTE — Progress Notes (Signed)
 Complete physical exam   Patient: Austin Larson   DOB: 07/02/1960   63 y.o. Male  MRN: [id number] Visit Date: 06/20/2023  Today's healthcare provider: Mila Merry, MD   Chief Complaint  Patient presents with   Annual Exam    No acute concerns    refferel    Pt would like to dicuss options for colonscopy/ colonguard   Subjective    Discussed the use of AI scribe software for clinical note transcription with the patient, who gave verbal consent to proceed.  History of Present Illness   Austin Larson is a 63 year old male who presents for an annual physical exam.  He recently underwent a cholecystectomy following a severe gallbladder attack. He had been advised to have the surgery for the past ten years but managed until a severe episode led to hospitalization and surgery. Post-surgery, he no longer experiences gallbladder attacks but notes gastrointestinal symptoms after consuming fatty foods, which he manages by adhering to a vegetarian diet.  He has a long-standing issue with back or side pain on the right side, present for ten to fifteen years. The pain occurs with certain movements or positions but is not debilitating. Numerous tests over the years have not revealed significant findings.  He recalls a previous episode of gout diagnosed at an urgent care facility a year or two ago, but he has not experienced any symptoms since. He attributes this to his relatively healthy diet and increased physical activity.  He notes a perceived decline in hearing and is interested in pursuing an audiology evaluation.  He mentions a need to see a dermatologist, as he has not had a skin check since before the COVID pandemic, and his father had a history of skin issues.  He is more physically active now, engaging in cardiovascular and range of motion exercises with his wife, who has MS. Additionally, he has been playing baseball with his grandson, which he notes is more challenging at his  current age compared to when he was younger. He has no history of smoking, although he occasionally smoked cigars in the past, but not in the last twenty years. He is currently working part-time, approximately ten hours a week, in teaching and light activities.        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 fingers   ROBOTIC ASSISTED LAPAROSCOPIC CHOLECYSTECTOMY  04/2021   [facility]   TONSILLECTOMY  03/18/1968   Social History   Socioeconomic History   Marital status: Married    Spouse name: Not on file   Number of children: 3   Years of education: Coll Grad   Highest education level: Not on file  Occupational History   Occupation: Teacher, music  Tobacco Use   Smoking status: Former    Types: Cigars   Smokeless tobacco: Never   Tobacco comments:    quit cigars around 2016  Substance and Sexual Activity   Alcohol use: Not Currently    Comment: 1 beer every 2 weeks   Drug use: No   Sexual activity: Not Currently  Other Topics Concern   Not on file  Social History Narrative   Not on file   Social Drivers of Health   Financial Resource Strain: Low Risk  (06/20/2023)  Overall Financial Resource Strain (CARDIA)    Difficulty of Paying Living Expenses: Not hard at all  Food Insecurity: No Food Insecurity (06/20/2023)   Hunger Vital Sign    Worried About Running Out of Food in the Last Year: Never true    Ran Out of Food in the Last Year: Never true  Transportation Needs: No Transportation Needs (06/20/2023)   PRAPARE - Administrator, Civil Service (Medical): No    Lack of Transportation (Non-Medical): No  Physical Activity: Insufficiently Active (06/20/2023)   Exercise Vital Sign    Days of Exercise per Week: 2 days    Minutes of Exercise per Session: 30 min  Stress: No  Stress Concern Present (06/20/2023)   [employer] of Occupational Health - Occupational Stress Questionnaire    Feeling of Stress : Not at all  Social Connections: Not on file  Intimate Partner Violence: Not At Risk (06/20/2023)   Humiliation, Afraid, Rape, and Kick questionnaire    Fear of Current or Ex-Partner: No    Emotionally Abused: No    Physically Abused: No    Sexually Abused: No   Family Status  Relation Name Status   Mother  Deceased at age 80       Breast and Ovarian cancer   Father  Deceased   Sister  Nature conservation officer   Daughter  Alive   Daughter  Alive   Daughter  Alive  No partnership data on file   Family History  Problem Relation Age of Onset   Ovarian cancer Mother    Breast cancer Mother    Diabetes Father    Heart disease Father    Bipolar disorder Father    No Known Allergies  Patient Care Team: Malva Limes, MD as PCP - General (Family Medicine) [facility], [facility] (Optometry)   Medications: Outpatient Medications Prior to Visit  Medication Sig   MULTIPLE VITAMIN PO Take by mouth.   Omega-3 Fatty Acids (FISH OIL) 1000 MG CAPS Take by mouth.   Coenzyme Q10 (COQ10 PO) Take 1 tablet by mouth daily. (Patient not taking: Reported on 06/22/2021)   No facility-administered medications prior to visit.    Review of Systems    Objective    BP 136/86   Pulse 62   Temp 98.2 F (36.8 C)   Ht 6' (1.829 m)   Wt 228 lb 11.2 oz (103.7 kg)   SpO2 98%   BMI 31.02 kg/m    Physical Exam  General Appearance:    Mildly obese male. Alert, cooperative, in no acute distress, appears stated age  Head:    Normocephalic, without obvious abnormality, atraumatic  Eyes:    PERRL, conjunctiva/corneas clear, EOM's intact, fundi    benign, both eyes       Ears:    Normal TM's and external ear canals, both ears  Nose:   Nares normal, septum midline, mucosa normal, no drainage   or sinus tenderness  Throat:   Lips, mucosa, and tongue normal; teeth  and gums normal  Neck:   Supple, symmetrical, trachea midline, no adenopathy;       thyroid:  No enlargement/tenderness/nodules; no carotid   bruit or JVD  Back:     Symmetric, no curvature, ROM normal, no CVA tenderness  Lungs:     Clear to auscultation bilaterally, respirations unlabored  Chest wall:    No tenderness or deformity  Heart:    Normal heart rate. Normal rhythm. No  murmurs, rubs, or gallops.  S1 and S2 normal  Abdomen:     Soft, non-tender, bowel sounds active all four quadrants,    no masses, no organomegaly  Genitalia:    deferred  Rectal:    deferred  Extremities:   No cyanosis or edema  Pulses:   2+ and symmetric all extremities  Skin:   Moderate sun damage Ues and face with several  lesions c/with actinic keratoses and cherry hemangiomas.   Lymph nodes:   Cervical, supraclavicular, and axillary nodes normal  Neurologic:   CNII-XII intact. Normal strength, sensation and reflexes      throughout       Last depression screening scores    06/20/2023    8:20 AM 06/22/2021    8:13 AM 09/28/2019    2:10 PM  PHQ 2/9 Scores  PHQ - 2 Score 0 0 0  PHQ- 9 Score  0    Last fall risk screening    09/28/2019    2:09 PM  Fall Risk   Falls in the past year? 0  Number falls in past yr: 0  Injury with Fall? 0   Last Audit-C alcohol use screening    09/28/2019    2:09 PM  Alcohol Use Disorder Test (AUDIT)  1. How often do you have a drink containing alcohol? 1  2. How many drinks containing alcohol do you have on a typical day when you are drinking? 0  3. How often do you have six or more drinks on one occasion? 0  AUDIT-C Score 1  Alcohol Brief Interventions/Follow-up AUDIT Score <7 follow-up not indicated   A score of 3 or more in women, and 4 or more in men indicates increased risk for alcohol abuse, EXCEPT if all of the points are from question 1   No results found for any visits on 06/20/23.  Assessment & Plan    Routine Health Maintenance and Physical  Exam  Exercise Activities and Dietary recommendations  Goals   None     Immunization History  Administered Date(s) Administered   Influenza,inj,Quad PF,6+ Mos 12/16/2013, 01/25/2016, 12/23/2017   MMR 04/06/1987   PFIZER Comirnaty(Gray Top)Covid-19 Tri-Sucrose Vaccine 04/10/2020   PFIZER(Purple Top)SARS-COV-2 Vaccination 06/03/2019, 06/29/2019   Tdap 08/17/2008, 09/28/2019   Zoster Recombinant(Shingrix) 12/23/2017, 03/02/2018    Health Maintenance  Topic Date Due   Colonoscopy  04/16/2013   COVID-19 Vaccine (4 - 2024-25 season) 11/17/2022   INFLUENZA VACCINE  10/17/2023   DTaP/Tdap/Td (3 - Td or Tdap) 09/27/2029   Hepatitis C Screening  Completed   HIV Screening  Completed   Zoster Vaccines- Shingrix  Completed   HPV VACCINES  Aged Out    Discussed health benefits of physical activity, and encouraged him to engage in regular exercise appropriate for his age and condition.  Recommended prevnar vaccine which he declined today       2. Prostate cancer screening  - PSA Total (Reflex To Free)  3. Colon cancer screening  - Cologuard  4. Encounter for special screening examination for cardiovascular disorder  - Lipid panel  5. Fatty liver  - CBC with Differential/Platelet - Comprehensive metabolic panel with GFR  6. Dyslipidemia Checking lipids today.   7. Actinic keratoses Previously followed by Dr. Gwen Pounds.  - Ambulatory referral to Dermatology  8. Decreased hearing of both ears  - Ambulatory referral to Audiology        Mila Merry, MD  [facility] [phone] (phone) [phone] (fax)  [facility]

## 2023-06-20 NOTE — Patient Instructions (Signed)
 Please review the attached list of medications and notify my office if there are any errors.   I recommend that you get the Prevnar 20 vaccine to protect yourself from certain dangerous strains of pneumonia. You can get Prevnar 20 at your pharmacy, or call our office at [phone] at your earliest convenience to schedule this vaccine.

## 2023-06-21 LAB — CBC WITH DIFFERENTIAL/PLATELET
Basophils Absolute: 0.1 10*3/uL (ref 0.0–0.2)
Basos: 1 %
EOS (ABSOLUTE): 0.1 10*3/uL (ref 0.0–0.4)
Eos: 1 %
Hematocrit: 47.2 % (ref 37.5–51.0)
Hemoglobin: 15.7 g/dL (ref 13.0–17.7)
Immature Grans (Abs): 0 10*3/uL (ref 0.0–0.1)
Immature Granulocytes: 0 %
Lymphocytes Absolute: 2.8 10*3/uL (ref 0.7–3.1)
Lymphs: 37 %
MCH: 29.3 pg (ref 26.6–33.0)
MCHC: 33.3 g/dL (ref 31.5–35.7)
MCV: 88 fL (ref 79–97)
Monocytes Absolute: 0.6 10*3/uL (ref 0.1–0.9)
Monocytes: 8 %
Neutrophils Absolute: 4.1 10*3/uL (ref 1.4–7.0)
Neutrophils: 53 %
Platelets: 206 10*3/uL (ref 150–450)
RBC: 5.36 x10E6/uL (ref 4.14–5.80)
RDW: 13.1 % (ref 11.6–15.4)
WBC: 7.7 10*3/uL (ref 3.4–10.8)

## 2023-06-21 LAB — LIPID PANEL
Chol/HDL Ratio: 4.8 ratio (ref 0.0–5.0)
Cholesterol, Total: 178 mg/dL (ref 100–199)
HDL: 37 mg/dL — ABNORMAL LOW (ref >39)
LDL Chol Calc (NIH): 112 mg/dL — ABNORMAL HIGH (ref 0–99)
Triglycerides: 164 mg/dL — ABNORMAL HIGH (ref 0–149)
VLDL Cholesterol Cal: 29 mg/dL (ref 5–40)

## 2023-06-21 LAB — COMPREHENSIVE METABOLIC PANEL WITH GFR
ALT: 14 IU/L (ref 0–44)
AST: 17 IU/L (ref 0–40)
Albumin: 4 g/dL (ref 3.9–4.9)
Alkaline Phosphatase: 72 IU/L (ref 44–121)
BUN/Creatinine Ratio: 9 — ABNORMAL LOW (ref 10–24)
BUN: 13 mg/dL (ref 8–27)
Bilirubin Total: 0.6 mg/dL (ref 0.0–1.2)
CO2: 23 mmol/L (ref 20–29)
Calcium: 9.5 mg/dL (ref 8.6–10.2)
Chloride: 104 mmol/L (ref 96–106)
Creatinine, Ser: 1.37 mg/dL — ABNORMAL HIGH (ref 0.76–1.27)
Globulin, Total: 1.9 g/dL (ref 1.5–4.5)
Glucose: 86 mg/dL (ref 70–99)
Potassium: 4.6 mmol/L (ref 3.5–5.2)
Sodium: 141 mmol/L (ref 134–144)
Total Protein: 5.9 g/dL — ABNORMAL LOW (ref 6.0–8.5)
eGFR: 58 mL/min/{1.73_m2} — ABNORMAL LOW (ref >59)

## 2023-06-21 LAB — PSA TOTAL (REFLEX TO FREE): Prostate Specific Ag, Serum: 2.3 ng/mL (ref 0.0–4.0)

## 2023-06-22 ENCOUNTER — Encounter: Payer: Self-pay | Admitting: Family Medicine

## 2023-07-15 LAB — COLOGUARD: COLOGUARD: NEGATIVE

## 2023-07-16 ENCOUNTER — Encounter: Payer: Self-pay | Admitting: Family Medicine
